# Patient Record
Sex: Female | Born: 1980 | Race: White | Hispanic: No | Marital: Married | Smoking: Current every day smoker
Health system: Southern US, Community
[De-identification: ages and names within clinical notes are randomized; demographics above are authoritative.]

## PROBLEM LIST (undated history)

## (undated) DIAGNOSIS — F329 Major depressive disorder, single episode, unspecified: Secondary | ICD-10-CM

## (undated) DIAGNOSIS — M8700 Idiopathic aseptic necrosis of unspecified bone: Secondary | ICD-10-CM

## (undated) DIAGNOSIS — M797 Fibromyalgia: Secondary | ICD-10-CM

## (undated) DIAGNOSIS — F32A Depression, unspecified: Secondary | ICD-10-CM

## (undated) DIAGNOSIS — K219 Gastro-esophageal reflux disease without esophagitis: Secondary | ICD-10-CM

## (undated) DIAGNOSIS — F4310 Post-traumatic stress disorder, unspecified: Secondary | ICD-10-CM

## (undated) DIAGNOSIS — T7840XA Allergy, unspecified, initial encounter: Secondary | ICD-10-CM

## (undated) DIAGNOSIS — M1990 Unspecified osteoarthritis, unspecified site: Secondary | ICD-10-CM

## (undated) DIAGNOSIS — F419 Anxiety disorder, unspecified: Secondary | ICD-10-CM

## (undated) DIAGNOSIS — G43909 Migraine, unspecified, not intractable, without status migrainosus: Secondary | ICD-10-CM

## (undated) DIAGNOSIS — J45909 Unspecified asthma, uncomplicated: Secondary | ICD-10-CM

## (undated) HISTORY — DX: Anxiety disorder, unspecified: F41.9

## (undated) HISTORY — PX: FOOT SURGERY: SHX648

## (undated) HISTORY — DX: Unspecified osteoarthritis, unspecified site: M19.90

## (undated) HISTORY — PX: TONSILLECTOMY: SUR1361

## (undated) HISTORY — DX: Major depressive disorder, single episode, unspecified: F32.9

## (undated) HISTORY — DX: Allergy, unspecified, initial encounter: T78.40XA

## (undated) HISTORY — DX: Migraine, unspecified, not intractable, without status migrainosus: G43.909

## (undated) HISTORY — DX: Unspecified asthma, uncomplicated: J45.909

## (undated) HISTORY — PX: CHOLECYSTECTOMY: SHX55

## (undated) HISTORY — DX: Idiopathic aseptic necrosis of unspecified bone: M87.00

## (undated) HISTORY — PX: JOINT REPLACEMENT: SHX530

## (undated) HISTORY — DX: Post-traumatic stress disorder, unspecified: F43.10

## (undated) HISTORY — DX: Gastro-esophageal reflux disease without esophagitis: K21.9

## (undated) HISTORY — PX: ABDOMINAL HYSTERECTOMY: SHX81

## (undated) HISTORY — DX: Depression, unspecified: F32.A

## (undated) HISTORY — PX: TUBAL LIGATION: SHX77

## (undated) HISTORY — PX: BLADDER SUSPENSION: SHX72

---

## 2013-12-07 ENCOUNTER — Ambulatory Visit (INDEPENDENT_AMBULATORY_CARE_PROVIDER_SITE_OTHER): Payer: Medicare Other | Admitting: General Surgery

## 2013-12-07 ENCOUNTER — Other Ambulatory Visit (INDEPENDENT_AMBULATORY_CARE_PROVIDER_SITE_OTHER): Payer: Self-pay | Admitting: General Surgery

## 2013-12-07 ENCOUNTER — Encounter (INDEPENDENT_AMBULATORY_CARE_PROVIDER_SITE_OTHER): Payer: Self-pay | Admitting: General Surgery

## 2013-12-07 VITALS — BP 116/72 | HR 86 | Temp 97.0°F | Resp 18 | Ht 67.0 in | Wt 261.6 lb

## 2013-12-07 DIAGNOSIS — M129 Arthropathy, unspecified: Secondary | ICD-10-CM

## 2013-12-07 DIAGNOSIS — K5900 Constipation, unspecified: Secondary | ICD-10-CM

## 2013-12-07 DIAGNOSIS — M8700 Idiopathic aseptic necrosis of unspecified bone: Secondary | ICD-10-CM

## 2013-12-07 DIAGNOSIS — K219 Gastro-esophageal reflux disease without esophagitis: Secondary | ICD-10-CM

## 2013-12-07 DIAGNOSIS — M25559 Pain in unspecified hip: Secondary | ICD-10-CM

## 2013-12-07 DIAGNOSIS — M1990 Unspecified osteoarthritis, unspecified site: Secondary | ICD-10-CM

## 2013-12-07 NOTE — Patient Instructions (Signed)
Congratulations on starting your journey to a healthier life! Over the next few weeks you will be undergoing tests (x-rays and labs) and seeing specialists to help evaluate you for weight loss surgery.  These tests and consultations with a psychologist and nutritionist are needed to prepare you for the lifestyle changes that lie ahead and are often required by insurance companies to approve you for surgery.   Pathway to Surgery:  Over the next few weeks -->Lab work -->Radiology tests   - Chest x-ray - make sure your lungs are normal before surgery  - Upper GI - you drink barium and pictures are taken as it travels down your  esophagus and into your stomach - looks for reflux and a hiatal hernia which may  need to repaired at the same time as your weight loss surgery  - Abdominal Ultrasound - looks at your gallbladder and liver  - Mammogram - up to date mammogram if you are a female -->EKG  -->Sleep study - if you are felt to be at high risk for obstructive sleep apnea -->H. Pylori breath test ([employer]) - you surgeon may order this test to see if you have  a bacteria (H pylori) in your stomach which makes you at higher risk to develop a  ulcer or inflammation of your stomach -->Nutrition consultation -->Psychologist consultation -->Other specialist consults - your surgeon may determine that you need to see a  specialist like a cardiologist or pulmnologist depending on your health history -->Watch EMMI video about your planned weight loss surgery -->you can look at [email or web address] to learn more about weight loss surgery and  compare surgery outcomes -->you can look at our new website - [email or web address] - available mid-June 2015  Two weeks prior to surgery  Go on the extremely low carb liquid diet - this will decrease the size of your liver  which will make surgery safer - the nutritionist will go over this at a later date  Attend preoperative appointment with your surgeon  Attend  preoperative surgery class  One week prior to surgery  No aspirin products.  Tylenol is acceptable   24 hours prior to surgery  No alcoholic beverages  Report fever greater than 100.5 or excessive nasal drainage suggesting infection  Continue bariatric preop diet  Perform bowel prep if ordered  Do not eat or drink anything after midnight the night before surgery  Do not take any medications except those instructed by the anesthesiologist  Morning of surgery  Please arrive at the hospital at least 2 hours before your scheduled surgery time.  No makeup, fingernail polish or jewelry  Bring insurance cards with you  Bring your CPAP mask if you use this  

## 2013-12-07 NOTE — Progress Notes (Signed)
Patient ID: Jill Bruce, female   DOB: 1980-08-21, 33 y.o.   MRN: [id number]  Chief Complaint  Patient presents with  . Bariatric Pre-op    HPI Jill Bruce is a 33 y.o. female.   HPI 33 year old morbidly obese Caucasian female referred by Dr. Danelle BerryGauthier from the [facility] for evaluation of weight loss surgery. The patient states her weight has been an issue probably for about the past 10 years. Her morbid obesity is starting to affect her quality of life and she is interested in improving her quality of life. Despite numerous attempts for sustained weight loss she has been unsuccessful. She has tried [employer] fast, Atkins, [employer] Watchers, as well as dieting and exercise - all without any long-term success. She was most successful with dieting and exercise and lost 60 pounds but regained it all back.  She is most interested in a sleeve gastrectomy. She believes this will suit her lifestyle the best. She has several acquaintances who have had the procedure who have had good outcomes. She believes that the outcome with a lap band is too variable.   She was in [employer] for 4 years. She participated in our online seminar as well as in in person seminar. Past Medical History  Diagnosis Date  . Arthritis   . Asthma   . GERD (gastroesophageal reflux disease)   . Allergy   . OSA on CPAP     Past Surgical History  Procedure Laterality Date  . Tubal ligation    . Abdominal hysterectomy    . Cholecystectomy    . Foot surgery      History reviewed. No pertinent family history.  Social History History  Substance Use Topics  . Smoking status: Former Smoker    Quit date: 05/09/2008  . Smokeless tobacco: Not on file  . Alcohol Use: No    Allergies  Allergen Reactions  . Tape   . Augmentin [Amoxicillin-Pot Clavulanate] Rash  . Latex Rash    Current Outpatient Prescriptions  Medication Sig Dispense Refill  . albuterol (PROVENTIL HFA;VENTOLIN HFA) 108 (90  BASE) MCG/ACT inhaler Inhale into the lungs every 6 (six) hours as needed for wheezing or shortness of breath.      . budesonide (PULMICORT) 180 MCG/ACT inhaler Inhale into the lungs 2 (two) times daily.      . cetirizine (ZYRTEC) 10 MG tablet Take 10 mg by mouth daily.      Marland Kitchen. HYDROcodone-acetaminophen (NORCO) 10-325 MG per tablet Take 1 tablet by mouth every 6 (six) hours as needed.      . meloxicam (MOBIC) 15 MG tablet Take 15 mg by mouth daily.      . methylphenidate (RITALIN) 10 MG tablet Take 10 mg by mouth 2 (two) times daily.      . montelukast (SINGULAIR) 10 MG tablet Take 10 mg by mouth at bedtime.      . pantoprazole (PROTONIX) 40 MG tablet Take 40 mg by mouth daily.      . ranitidine (ZANTAC) 300 MG tablet Take 300 mg by mouth at bedtime.      . traMADol (ULTRAM) 50 MG tablet Take 50 mg by mouth every 6 (six) hours as needed.       No current facility-administered medications for this visit.    Review of Systems Review of Systems  Constitutional: Negative for fever, activity change, appetite change and unexpected weight change.  HENT: Negative for nosebleeds and trouble swallowing.   Eyes: Negative for photophobia and  visual disturbance.  Respiratory: Negative for chest tightness and shortness of breath.        Some occasional asthma symptoms takes an inhaler as needed. Remote hospitalization in 2009 for pneumonia for 5 days; positive obstructive sleep apnea on CPAP  Cardiovascular: Negative for chest pain and leg swelling.       Denies CP, SOB, orthopnea, PND, DOE  Gastrointestinal: Positive for constipation. Negative for nausea, vomiting, abdominal pain, blood in stool and abdominal distention.       Status post cholecystectomy; has reflux, takes medications for reflux. Chronic constipation-averages about one bowel movement a week  Genitourinary: Negative for dysuria, hematuria and difficulty urinating.       Status post hysterectomy secondary to "varicose veins in uterus"; has  ovaries  Musculoskeletal: Positive for arthralgias.       Has bilateral hip pain as well as bilateral knee pain. Has been diagnosed with avascular necrosis. Followed by orthopedic doctors at the [facility] in [place]. Has been getting intermittent injections  Skin: Negative for pallor and rash.  Neurological: Positive for headaches. Negative for dizziness, seizures, facial asymmetry and numbness.       Denies TIA and amaurosis fugax; has migraines 3-4 times he   Hematological: Negative for adenopathy. Does not bruise/bleed easily.  Psychiatric/Behavioral: Negative for behavioral problems and agitation.    Blood pressure 116/72, pulse 86, temperature 97 F (36.1 C), temperature source Temporal, resp. rate 18, height 5\' 7"  (1.702 m), weight 261 lb 9.6 oz (118.661 kg).  Physical Exam Physical Exam  Vitals reviewed. Constitutional: She is oriented to person, place, and time. She appears well-developed and well-nourished. No distress.  Morbidly obese  HENT:  Head: Normocephalic and atraumatic.  Right Ear: External ear normal.  Left Ear: External ear normal.  Eyes: Conjunctivae are normal. No scleral icterus.  Neck: Normal range of motion. Neck supple. No tracheal deviation present. No thyromegaly present.  Cardiovascular: Normal rate and normal heart sounds.   Pulmonary/Chest: Effort normal and breath sounds normal. No stridor. No respiratory distress. She has no wheezes.  Abdominal: Soft. She exhibits no distension. There is no tenderness. There is no rebound and no guarding.  Well-healed trocar site  Musculoskeletal: She exhibits no edema and no tenderness.  Lymphadenopathy:    She has no cervical adenopathy.  Neurological: She is alert and oriented to person, place, and time. She exhibits normal muscle tone.  Skin: Skin is warm and dry. No rash noted. She is not diaphoretic. No erythema.  Psychiatric: She has a normal mood and affect. Her behavior is normal. Judgment and thought content  normal.    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 I think the patient would be an acceptable candidate for Laparoscopic vertical sleeve gastrectomy.   We discussed laparoscopic sleeve gastrectomy. We discussed the preoperative, operative and postoperative process. Using diagrams, I explained the surgery in detail including the performance of an EGD near the end of the surgery and an Upper GI swallow study on POD 1. We discussed the typical hospital course including a 2-3 day stay baring any complications.   The patient was given educational material. I quoted the patient that most patients can lose up to 50-70% of their excess weight. We did discuss the possibility of weight regain several years  after the procedure.  The risks of infection, bleeding, pain, scarring, weight regain, too little or too much weight loss, vitamin deficiencies and need for lifelong vitamin supplementation, hair loss, need for protein supplementation, leaks, stricture, reflux, food intolerance, gallstone formation, hernia, need for reoperation and conversion to roux Y gastric bypass, need for open surgery, injury to spleen or surrounding structures, DVT's, PE, and death again discussed with the patient and the patient expressed understanding and desires to proceed with laparoscopic vertical sleeve gastrectomy, possible open, intraoperative endoscopy.  We discussed that before and after surgery that there would be an alteration in their diet. I explained that we have put them on a diet 2 weeks before surgery. I also explained that they would be on a liquid diet for 2 weeks after surgery. We discussed that they would have to avoid certain foods after surgery. We discussed the importance of physical  activity as well as compliance with our dietary and supplement recommendations and routine follow-up.  I explained to the patient that we will start our evaluation process which includes labs, Upper GI to evaluate stomach and swallowing anatomy, nutritionist consultation, psychiatrist consultation, EKG, CXR.  She was also given access to watch the EMMI video on sleeve gastrectomy  Mary Sella. Andrey Campanile, MD, FACS General, Bariatric, & Minimally Invasive Surgery [facility], [place]         Atilano Ina 12/07/2013, 7:37 PM

## 2013-12-25 ENCOUNTER — Other Ambulatory Visit (INDEPENDENT_AMBULATORY_CARE_PROVIDER_SITE_OTHER): Payer: Self-pay | Admitting: General Surgery

## 2013-12-25 LAB — CBC WITH DIFFERENTIAL/PLATELET
BASOS ABS: 0.1 10*3/uL (ref 0.0–0.1)
BASOS PCT: 1 % (ref 0–1)
EOS PCT: 6 % — AB (ref 0–5)
Eosinophils Absolute: 0.5 10*3/uL (ref 0.0–0.7)
HCT: 38.8 % (ref 36.0–46.0)
Hemoglobin: 13.1 g/dL (ref 12.0–15.0)
Lymphocytes Relative: 27 % (ref 12–46)
Lymphs Abs: 2.4 10*3/uL (ref 0.7–4.0)
MCH: 28.6 pg (ref 26.0–34.0)
MCHC: 33.8 g/dL (ref 30.0–36.0)
MCV: 84.7 fL (ref 78.0–100.0)
Monocytes Absolute: 0.8 10*3/uL (ref 0.1–1.0)
Monocytes Relative: 9 % (ref 3–12)
Neutro Abs: 5 10*3/uL (ref 1.7–7.7)
Neutrophils Relative %: 57 % (ref 43–77)
Platelets: 334 10*3/uL (ref 150–400)
RBC: 4.58 MIL/uL (ref 3.87–5.11)
RDW: 14 % (ref 11.5–15.5)
WBC: 8.8 10*3/uL (ref 4.0–10.5)

## 2013-12-25 LAB — CMP AND LIVER
ALT: 14 U/L (ref 0–35)
AST: 18 U/L (ref 0–37)
Albumin: 4.3 g/dL (ref 3.5–5.2)
Alkaline Phosphatase: 68 U/L (ref 39–117)
BILIRUBIN DIRECT: 0.1 mg/dL (ref 0.0–0.3)
BILIRUBIN TOTAL: 0.5 mg/dL (ref 0.2–1.2)
BUN: 11 mg/dL (ref 6–23)
CHLORIDE: 106 meq/L (ref 96–112)
CO2: 23 meq/L (ref 19–32)
Calcium: 9.4 mg/dL (ref 8.4–10.5)
Creat: 0.82 mg/dL (ref 0.50–1.10)
Glucose, Bld: 80 mg/dL (ref 70–99)
Indirect Bilirubin: 0.4 mg/dL (ref 0.2–1.2)
Potassium: 4.3 mEq/L (ref 3.5–5.3)
SODIUM: 139 meq/L (ref 135–145)
TOTAL PROTEIN: 7 g/dL (ref 6.0–8.3)

## 2013-12-25 LAB — MAGNESIUM: Magnesium: 2.1 mg/dL (ref 1.5–2.5)

## 2013-12-25 LAB — LIPID PANEL
CHOL/HDL RATIO: 3.4 ratio
CHOLESTEROL: 144 mg/dL (ref 0–200)
HDL: 42 mg/dL (ref >39)
LDL Cholesterol: 88 mg/dL (ref 0–99)
Triglycerides: 68 mg/dL (ref <150)
VLDL: 14 mg/dL (ref 0–40)

## 2013-12-25 LAB — IRON AND TIBC
%SAT: 23 % (ref 20–55)
Iron: 68 ug/dL (ref 42–145)
TIBC: 300 ug/dL (ref 250–470)
UIBC: 232 ug/dL (ref 125–400)

## 2013-12-25 LAB — HEMOGLOBIN A1C
HEMOGLOBIN A1C: 5.8 % — AB (ref <5.7)
Mean Plasma Glucose: 120 mg/dL — ABNORMAL HIGH (ref <117)

## 2013-12-26 LAB — URINALYSIS
BILIRUBIN URINE: NEGATIVE
GLUCOSE, UA: NEGATIVE mg/dL
HGB URINE DIPSTICK: NEGATIVE
Ketones, ur: NEGATIVE mg/dL
Nitrite: NEGATIVE
PROTEIN: NEGATIVE mg/dL
Specific Gravity, Urine: 1.024 (ref 1.005–1.030)
Urobilinogen, UA: 0.2 mg/dL (ref 0.0–1.0)
pH: 5 (ref 5.0–8.0)

## 2013-12-26 LAB — H. PYLORI ANTIBODY, IGG: H Pylori IgG: 0.82 {ISR}

## 2013-12-26 LAB — TSH: TSH: 0.963 u[IU]/mL (ref 0.350–4.500)

## 2014-01-04 ENCOUNTER — Ambulatory Visit (HOSPITAL_COMMUNITY)
Admission: RE | Admit: 2014-01-04 | Discharge: 2014-01-04 | Disposition: A | Discharge status: Home / Self Care | Payer: Medicare Other | Source: Ambulatory Visit | Attending: General Surgery | Admitting: General Surgery

## 2014-01-04 ENCOUNTER — Other Ambulatory Visit: Payer: Self-pay

## 2014-01-04 DIAGNOSIS — M1990 Unspecified osteoarthritis, unspecified site: Secondary | ICD-10-CM | POA: Insufficient documentation

## 2014-01-04 DIAGNOSIS — K219 Gastro-esophageal reflux disease without esophagitis: Secondary | ICD-10-CM | POA: Insufficient documentation

## 2014-01-04 DIAGNOSIS — G4733 Obstructive sleep apnea (adult) (pediatric): Secondary | ICD-10-CM | POA: Insufficient documentation

## 2014-01-04 DIAGNOSIS — J45909 Unspecified asthma, uncomplicated: Secondary | ICD-10-CM | POA: Insufficient documentation

## 2014-01-04 DIAGNOSIS — Z9089 Acquired absence of other organs: Secondary | ICD-10-CM | POA: Insufficient documentation

## 2014-01-04 DIAGNOSIS — M8700 Idiopathic aseptic necrosis of unspecified bone: Secondary | ICD-10-CM | POA: Insufficient documentation

## 2014-01-04 DIAGNOSIS — M25559 Pain in unspecified hip: Secondary | ICD-10-CM | POA: Insufficient documentation

## 2014-01-04 DIAGNOSIS — K5900 Constipation, unspecified: Secondary | ICD-10-CM | POA: Insufficient documentation

## 2014-01-04 DIAGNOSIS — M129 Arthropathy, unspecified: Secondary | ICD-10-CM | POA: Insufficient documentation

## 2014-01-04 DIAGNOSIS — Z6841 Body Mass Index (BMI) 40.0 and over, adult: Secondary | ICD-10-CM | POA: Insufficient documentation

## 2014-01-22 ENCOUNTER — Encounter: Payer: Medicare Other | Attending: General Surgery | Admitting: Dietician

## 2014-01-22 ENCOUNTER — Encounter: Payer: Self-pay | Admitting: Dietician

## 2014-01-22 VITALS — Ht 67.0 in | Wt 258.2 lb

## 2014-01-22 DIAGNOSIS — Z8249 Family history of ischemic heart disease and other diseases of the circulatory system: Secondary | ICD-10-CM | POA: Diagnosis not present

## 2014-01-22 DIAGNOSIS — J45909 Unspecified asthma, uncomplicated: Secondary | ICD-10-CM | POA: Diagnosis not present

## 2014-01-22 DIAGNOSIS — M129 Arthropathy, unspecified: Secondary | ICD-10-CM | POA: Diagnosis not present

## 2014-01-22 DIAGNOSIS — G4733 Obstructive sleep apnea (adult) (pediatric): Secondary | ICD-10-CM | POA: Diagnosis not present

## 2014-01-22 DIAGNOSIS — Z713 Dietary counseling and surveillance: Secondary | ICD-10-CM | POA: Diagnosis present

## 2014-01-22 DIAGNOSIS — Z79899 Other long term (current) drug therapy: Secondary | ICD-10-CM | POA: Diagnosis not present

## 2014-01-22 DIAGNOSIS — Z833 Family history of diabetes mellitus: Secondary | ICD-10-CM | POA: Diagnosis not present

## 2014-01-22 DIAGNOSIS — K219 Gastro-esophageal reflux disease without esophagitis: Secondary | ICD-10-CM | POA: Diagnosis not present

## 2014-01-22 DIAGNOSIS — Z01818 Encounter for other preprocedural examination: Secondary | ICD-10-CM | POA: Diagnosis present

## 2014-01-22 DIAGNOSIS — Z6841 Body Mass Index (BMI) 40.0 and over, adult: Secondary | ICD-10-CM | POA: Diagnosis not present

## 2014-01-22 NOTE — Progress Notes (Signed)
  Pre-Op Assessment Visit:  Pre-Operative Sleeve Gastrectomy Surgery  Medical Nutrition Therapy:  Appt start time: 1430   End time:  1500.  Patient was seen on 01/22/2014 for Pre-Operative Sleeve Gastrectomy Nutrition Assessment. [facility] on 01/22/2014.   Preferred Learning Style:   No preference indicated   Learning Readiness:   Ready  Handouts given during visit include:  Pre-Op Goals Bariatric Surgery Protein Shakes  Teaching Method Utilized:  Visual Auditory Hands on  Barriers to learning/adherence to lifestyle change: joint pain makes exercise difficult  Demonstrated degree of understanding via:  Teach Back   Patient to call the [facility] to enroll in Pre-Op and Post-Op Nutrition Education when surgery date is scheduled.

## 2014-01-22 NOTE — Patient Instructions (Signed)
Follow Pre-Op Goals Try Protein Shakes  

## 2014-02-28 ENCOUNTER — Ambulatory Visit: Payer: Medicare Other | Admitting: Dietician

## 2014-03-06 ENCOUNTER — Encounter: Payer: Self-pay | Admitting: Dietician

## 2014-03-06 ENCOUNTER — Encounter: Payer: Medicare Other | Attending: General Surgery | Admitting: Dietician

## 2014-03-06 VITALS — Ht 67.0 in | Wt 245.0 lb

## 2014-03-06 DIAGNOSIS — Z713 Dietary counseling and surveillance: Secondary | ICD-10-CM | POA: Insufficient documentation

## 2014-03-06 DIAGNOSIS — Z6841 Body Mass Index (BMI) 40.0 and over, adult: Secondary | ICD-10-CM | POA: Insufficient documentation

## 2014-03-06 NOTE — Progress Notes (Signed)
  Medical Nutrition Therapy:  Appt start time: 315 end time:  330.  SWL visit 1:  Primary concerns today: Jill Bruce is here today for her 1st SWL visit out of 6 in preparation for sleeve gastrectomy. She recently lost 15 pounds, as she doesn't have an appetite due to recovering from knee replacement surgery. She reports she has been having protein shakes for breakfast and lunch. Has tried a variety of brands and flavors that meet recommended protein and carbohydrate criteria. Jill Bruce states she has been practicing chewing thoroughly and trying not to skip meals.  Sticking to baked and grilled foods.  MEDICATIONS: see list  DIETARY INTAKE:  24-hr recall:  B (AM): Protein shake  Snk ( AM) :cereal bar  L ( PM): grilled cheese or protein shake  Snk (PM): popcorn D ( PM): shake and bake pork chop; spaghetti   Snk ( PM): none Beverages: Water or diet Dr. Reino Kent  Recent physical activity: physical therapy 5x a week from knee replacement  Estimated energy needs: 1600-1800 calories  Progress Towards Goal(s):  In progress.   Nutritional Diagnosis:  Jill Bruce-3.3 Overweight/obesity related to past poor dietary habits and physical inactivity as evidenced by patient w/ pending gastric sleeve surgery following dietary guidelines for continued weight loss.     Intervention:  Nutrition counseling provided.   Monitoring/Evaluation:  Dietary intake, exercise, and body weight in 4 week(s).

## 2014-04-09 ENCOUNTER — Encounter: Payer: Medicare Other | Attending: General Surgery | Admitting: Dietician

## 2014-04-09 VITALS — Wt 237.5 lb

## 2014-04-09 DIAGNOSIS — Z6841 Body Mass Index (BMI) 40.0 and over, adult: Secondary | ICD-10-CM | POA: Insufficient documentation

## 2014-04-09 DIAGNOSIS — Z713 Dietary counseling and surveillance: Secondary | ICD-10-CM | POA: Insufficient documentation

## 2014-04-09 NOTE — Progress Notes (Signed)
  Medical Nutrition Therapy:  Appt start time: 905 end time:  920.  SWL visit 2:  Primary concerns today: Jill Bruce retuns today for her 2nd SWL visit out of 6 in preparation for sleeve gastrectomy. Has lost 8 pounds in the last month. She has not had much of an appetite since her knee replacement surgery. She is having very small portions but not skipping meals. Working on reducing Dr. Reino Kent and practicing not drinking while eating. Has tried Insurance account manager and [employer] protein shakes and likes them. Seems very motivated. Has fear of surgery, just started Prozac and Xanax. Seeing psychiatrist 1x a month.   MEDICATIONS: see list  DIETARY INTAKE:  24-hr recall:  B (AM): unsweet tea and fruit parfait from [employer] Snk ( AM) : L ( PM): sandwich or small serving of mac and cheese Snk (PM):  D ( PM): shake and bake chicken or baked pork chop; spaghetti   Snk ( PM): none  Beverages: unsweet tea, mostly water or diet Dr. Reino Kent  Recent physical activity: physical therapy 2x a week for an hour  Estimated energy needs: 1600-1800 calories  Progress Towards Goal(s):  In progress.   Nutritional Diagnosis:  [place]-3.3 Overweight/obesity related to past poor dietary habits and physical inactivity as evidenced by patient w/ pending gastric sleeve surgery following dietary guidelines for continued weight loss.     Intervention:  Nutrition counseling provided.   Monitoring/Evaluation:  Dietary intake, exercise, and body weight in 4 week(s).

## 2014-04-09 NOTE — Patient Instructions (Signed)
Follow Pre-Op Goals Try Protein Shakes  

## 2014-05-09 ENCOUNTER — Encounter: Payer: Medicare Other | Attending: General Surgery | Admitting: Dietician

## 2014-05-09 VITALS — Ht 67.0 in | Wt 226.9 lb

## 2014-05-09 DIAGNOSIS — Z6835 Body mass index (BMI) 35.0-35.9, adult: Secondary | ICD-10-CM | POA: Insufficient documentation

## 2014-05-09 DIAGNOSIS — Z713 Dietary counseling and surveillance: Secondary | ICD-10-CM | POA: Insufficient documentation

## 2014-05-09 NOTE — Patient Instructions (Signed)
Follow Pre-Op Goals Try Protein Shakes

## 2014-05-09 NOTE — Progress Notes (Signed)
  Medical Nutrition Therapy:  Appt start time: 1000 end time:  1015  SWL visit 3:  Primary concerns today: Jill Bruce retuns today for her 3rd SWL visit out of 6 in preparation for sleeve gastrectomy. She has lost another 10 pounds and attributes this to lack of appetite from most recent surgery. She has been eating small meals throughout the day and drinking less soda and more water. [name] states that her mobility has greatly improved. Has a hip replacement scheduled in December.   Wt Readings from Last 3 Encounters:  05/09/14 226 lb 14.4 oz (102.921 kg)  04/09/14 237 lb 8 oz (107.729 kg)  03/06/14 245 lb (111.131 kg)   Ht Readings from Last 3 Encounters:  05/09/14 5\' 7"  (1.702 m)  03/06/14 5\' 7"  (1.702 m)  01/22/14 5\' 7"  (1.702 m)   Body mass index is 35.53 kg/(m^2). @BMIFA @ Normalized weight-for-age data available only for age 25 to 20 years. Normalized stature-for-age data available only for age 25 to 20 years.   MEDICATIONS: see list  DIETARY INTAKE:  24-hr recall:  B (AM): unsweet tea and fruit parfait from [employer] Snk ( AM) : L ( PM): sandwich or small serving of mac and cheese Snk (PM):  D ( PM): shake and bake chicken or baked pork chop; spaghetti   Snk ( PM): none  Beverages: unsweet tea, mostly water or diet Dr. [name]  Recent physical activity: physical therapy 2x a week for an hour  Estimated energy needs: 1600-1800 calories  Progress Towards Goal(s):  In progress.   Nutritional Diagnosis:  [place]-3.3 Overweight/obesity related to past poor dietary habits and physical inactivity as evidenced by patient w/ pending gastric sleeve surgery following dietary guidelines for continued weight loss.     Intervention:  Nutrition counseling provided.  Plan: continue working on pre op goals   Monitoring/Evaluation:  Dietary intake, exercise, and body weight in 4 week(s).

## 2014-06-11 ENCOUNTER — Encounter: Payer: Medicare Other | Attending: General Surgery | Admitting: Dietician

## 2014-06-11 VITALS — Wt 229.9 lb

## 2014-06-11 DIAGNOSIS — E669 Obesity, unspecified: Secondary | ICD-10-CM | POA: Diagnosis not present

## 2014-06-11 DIAGNOSIS — Z713 Dietary counseling and surveillance: Secondary | ICD-10-CM | POA: Insufficient documentation

## 2014-06-11 NOTE — Progress Notes (Signed)
  Medical Nutrition Therapy:  Appt start time: 1125 end time:  1140  SWL visit 4:  Primary concerns today: Jill Bruce retuns today for her 4th SWL visit out of 6 in preparation for sleeve gastrectomy. She has gained a few pounds in the last month. She also has a hip replacement scheduled in a few weeks. Has tried several different protein shakes and likes them. Still having some hair loss from previous surgery and taking multivitamin and Biotin. Down to 2 sodas a week.   Plan: Keep working on cutting back on soda. Keep practicing pre op goals.   Wt Readings from Last 3 Encounters:  06/11/14 229 lb 14.4 oz (104.282 kg)  05/09/14 226 lb 14.4 oz (102.921 kg)  04/09/14 237 lb 8 oz (107.729 kg)   Ht Readings from Last 3 Encounters:  05/09/14 5\' 7"  (1.702 m)  03/06/14 5\' 7"  (1.702 m)  01/22/14 5\' 7"  (1.702 m)   There is no weight on file to calculate BMI. @BMIFA @ Normalized weight-for-age data available only for age 62 to 20 years. Normalized stature-for-age data available only for age 62 to 20 years.   MEDICATIONS: see list  DIETARY INTAKE:  24-hr recall:  B (AM): unsweet tea and fruit parfait from [employer] Snk ( AM) : L ( PM): sandwich or small serving of mac and cheese Snk (PM):  D ( PM): shake and bake chicken or baked pork chop; spaghetti   Snk ( PM): none  Beverages: unsweet tea, mostly water or diet Dr. [name]  Recent physical activity: physical therapy 2x a week for an hour  Estimated energy needs: 1600-1800 calories  Progress Towards Goal(s):  In progress.   Nutritional Diagnosis:  [place]-3.3 Overweight/obesity related to past poor dietary habits and physical inactivity as evidenced by patient w/ pending gastric sleeve surgery following dietary guidelines for continued weight loss.     Intervention:  Nutrition counseling provided.  Plan: continue working on pre op goals   Monitoring/Evaluation:  Dietary intake, exercise, and body weight in 4 week(s).

## 2014-06-11 NOTE — Patient Instructions (Addendum)
-  Keep following Pre-Op Goals -Continue weaning off sodas

## 2014-07-11 ENCOUNTER — Encounter: Payer: Medicare Other | Attending: General Surgery | Admitting: Dietician

## 2014-07-11 VITALS — Ht 67.0 in | Wt 226.0 lb

## 2014-07-11 DIAGNOSIS — E669 Obesity, unspecified: Secondary | ICD-10-CM | POA: Diagnosis not present

## 2014-07-11 DIAGNOSIS — Z713 Dietary counseling and surveillance: Secondary | ICD-10-CM | POA: Insufficient documentation

## 2014-07-11 DIAGNOSIS — Z6835 Body mass index (BMI) 35.0-35.9, adult: Secondary | ICD-10-CM | POA: Diagnosis not present

## 2014-07-11 NOTE — Progress Notes (Signed)
  Medical Nutrition Therapy:  Appt start time: 1200 end time:  1215  SWL visit 6:  Primary concerns today: Jill Bruce retuns today for her 6th SWL visit out of 7 in preparation for sleeve gastrectomy. She has lost a few pounds since last visit. Jill just had hip replacement surgery on the 10th of December. She reports that she has been working on pre op goals and eating more frequently throughout the day. She reports fears that the weight she has lost won't be sustainable.   Wt Readings from Last 3 Encounters:  07/11/14 226 lb (102.513 kg)  06/11/14 229 lb 14.4 oz (104.282 kg)  05/09/14 226 lb 14.4 oz (102.921 kg)   Ht Readings from Last 3 Encounters:  05/09/14 5\' 7"  (1.702 m)  03/06/14 5\' 7"  (1.702 m)  01/22/14 5\' 7"  (1.702 m)   There is no weight on file to calculate BMI. @BMIFA @ Normalized weight-for-age data available only for age 20 to 20 years. Normalized stature-for-age data available only for age 20 to 20 years.   MEDICATIONS: see list  DIETARY INTAKE:  24-hr recall:  B (AM): unsweet tea and fruit parfait from [employer] Snk ( AM) : L ( PM): sandwich or small serving of mac and cheese Snk (PM):  D ( PM): shake and bake chicken or baked pork chop; spaghetti   Snk ( PM): none  Beverages: unsweet tea, mostly water or diet Dr. [name]  Recent physical activity: physical therapy 2x a week for an hour  Estimated energy needs: 1600-1800 calories  Progress Towards Goal(s):  In progress.   Nutritional Diagnosis:  Tara Bruce-3.3 Overweight/obesity related to past poor dietary habits and physical inactivity as evidenced by patient w/ pending gastric sleeve surgery following dietary guidelines for continued weight loss.     Intervention:  Nutrition counseling provided.  Plan: continue working on pre op goals   Monitoring/Evaluation:  Dietary intake, exercise, and body weight in 4 week(s).

## 2014-08-14 ENCOUNTER — Encounter: Payer: Medicare Other | Attending: General Surgery | Admitting: Dietician

## 2014-08-14 VITALS — Ht 67.0 in | Wt 229.8 lb

## 2014-08-14 DIAGNOSIS — Z713 Dietary counseling and surveillance: Secondary | ICD-10-CM | POA: Insufficient documentation

## 2014-08-14 DIAGNOSIS — Z6835 Body mass index (BMI) 35.0-35.9, adult: Secondary | ICD-10-CM | POA: Insufficient documentation

## 2014-08-14 DIAGNOSIS — E669 Obesity, unspecified: Secondary | ICD-10-CM | POA: Diagnosis not present

## 2014-08-14 NOTE — Progress Notes (Signed)
  Medical Nutrition Therapy:  Appt start time: 915 end time:  930  SWL visit 7:  Primary concerns today: Jill Bruce retuns today for her 7th SWL visit out of 7 in preparation for sleeve gastrectomy. She has gained about 3 lbs since last visit. Has been working on portions, chewing well, and drinking one soda per week.    She is in physical therapy for her hip and is not able to do more exercise at this time.   Wt Readings from Last 3 Encounters:  08/14/14 229 lb 12.8 oz (104.237 kg)  07/11/14 226 lb (102.513 kg)  06/11/14 229 lb 14.4 oz (104.282 kg)   Ht Readings from Last 3 Encounters:  08/14/14 5\' 7"  (1.702 m)  07/11/14 5\' 7"  (1.702 m)  05/09/14 5\' 7"  (1.702 m)   Body mass index is 35.98 kg/(m^2). @BMIFA @ Normalized weight-for-age data available only for age 39 to 20 years. Normalized stature-for-age data available only for age 39 to 20 years.   MEDICATIONS: see list  DIETARY INTAKE:  24-hr recall:  B (AM): unsweet tea and fruit parfait from [employer] Snk ( AM) : L ( PM): sandwich or small serving of mac and cheese Snk (PM):  D ( PM): shake and bake chicken or baked pork chop; spaghetti   Snk ( PM): none  Beverages: unsweet tea, mostly water or diet Dr. Reino Bruce  Recent physical activity: physical therapy 2x a week for an hour  Estimated energy needs: 1600-1800 calories  Progress Towards Goal(s):  In progress.   Nutritional Diagnosis:  [place]-3.3 Overweight/obesity related to past poor dietary habits and physical inactivity as evidenced by patient w/ pending gastric sleeve surgery following dietary guidelines for continued weight loss.     Intervention:  Nutrition counseling provided.  Plan:  -Keep following Pre-Op Goals -Continue weaning off sodas -Call Jill Bruce at [phone] when surgery is scheduled to enroll in Pre-Op Class   Monitoring/Evaluation:  Dietary intake, exercise, and body weight and return for Pre-Op class

## 2014-08-14 NOTE — Patient Instructions (Addendum)
-  Keep following Pre-Op Goals -Continue weaning off sodas -Call [facility] at [phone] when surgery is scheduled to enroll in Pre-Op Class

## 2015-01-11 DIAGNOSIS — M25551 Pain in right hip: Secondary | ICD-10-CM | POA: Diagnosis not present

## 2015-01-16 DIAGNOSIS — M25551 Pain in right hip: Secondary | ICD-10-CM | POA: Diagnosis not present

## 2015-01-18 DIAGNOSIS — M25551 Pain in right hip: Secondary | ICD-10-CM | POA: Diagnosis not present

## 2015-01-23 DIAGNOSIS — M25551 Pain in right hip: Secondary | ICD-10-CM | POA: Diagnosis not present

## 2015-01-25 DIAGNOSIS — M25551 Pain in right hip: Secondary | ICD-10-CM | POA: Diagnosis not present

## 2015-01-28 DIAGNOSIS — M25561 Pain in right knee: Secondary | ICD-10-CM | POA: Diagnosis not present

## 2015-01-29 DIAGNOSIS — M25551 Pain in right hip: Secondary | ICD-10-CM | POA: Diagnosis not present

## 2015-02-05 DIAGNOSIS — M25551 Pain in right hip: Secondary | ICD-10-CM | POA: Diagnosis not present

## 2015-04-01 DIAGNOSIS — Z471 Aftercare following joint replacement surgery: Secondary | ICD-10-CM | POA: Diagnosis not present

## 2015-04-01 DIAGNOSIS — M25561 Pain in right knee: Secondary | ICD-10-CM | POA: Diagnosis not present

## 2015-04-01 DIAGNOSIS — Z96641 Presence of right artificial hip joint: Secondary | ICD-10-CM | POA: Diagnosis not present

## 2015-04-10 DIAGNOSIS — J45909 Unspecified asthma, uncomplicated: Secondary | ICD-10-CM | POA: Diagnosis not present

## 2015-04-10 DIAGNOSIS — F329 Major depressive disorder, single episode, unspecified: Secondary | ICD-10-CM | POA: Diagnosis not present

## 2015-04-10 DIAGNOSIS — F419 Anxiety disorder, unspecified: Secondary | ICD-10-CM | POA: Diagnosis not present

## 2015-04-10 DIAGNOSIS — E669 Obesity, unspecified: Secondary | ICD-10-CM | POA: Diagnosis not present

## 2015-04-10 DIAGNOSIS — Z720 Tobacco use: Secondary | ICD-10-CM | POA: Diagnosis not present

## 2015-04-10 DIAGNOSIS — M25559 Pain in unspecified hip: Secondary | ICD-10-CM | POA: Diagnosis not present

## 2015-04-10 DIAGNOSIS — K219 Gastro-esophageal reflux disease without esophagitis: Secondary | ICD-10-CM | POA: Diagnosis not present

## 2015-04-10 DIAGNOSIS — Z6831 Body mass index (BMI) 31.0-31.9, adult: Secondary | ICD-10-CM | POA: Diagnosis not present

## 2015-04-10 DIAGNOSIS — M25569 Pain in unspecified knee: Secondary | ICD-10-CM | POA: Diagnosis not present

## 2015-04-13 DIAGNOSIS — N83202 Unspecified ovarian cyst, left side: Secondary | ICD-10-CM | POA: Diagnosis not present

## 2015-04-13 DIAGNOSIS — M549 Dorsalgia, unspecified: Secondary | ICD-10-CM | POA: Diagnosis not present

## 2015-04-13 DIAGNOSIS — N83209 Unspecified ovarian cyst, unspecified side: Secondary | ICD-10-CM | POA: Diagnosis not present

## 2015-04-13 DIAGNOSIS — R109 Unspecified abdominal pain: Secondary | ICD-10-CM | POA: Diagnosis not present

## 2015-05-16 DIAGNOSIS — F410 Panic disorder [episodic paroxysmal anxiety] without agoraphobia: Secondary | ICD-10-CM | POA: Diagnosis not present

## 2015-06-27 DIAGNOSIS — F4310 Post-traumatic stress disorder, unspecified: Secondary | ICD-10-CM | POA: Diagnosis not present

## 2015-07-01 ENCOUNTER — Emergency Department (HOSPITAL_COMMUNITY)
Admission: EM | Admit: 2015-07-01 | Discharge: 2015-07-01 | Disposition: A | Discharge status: Home / Self Care | Payer: Medicare PPO | Attending: Emergency Medicine | Admitting: Emergency Medicine

## 2015-07-01 ENCOUNTER — Encounter (HOSPITAL_COMMUNITY): Payer: Self-pay

## 2015-07-01 DIAGNOSIS — Z791 Long term (current) use of non-steroidal anti-inflammatories (NSAID): Secondary | ICD-10-CM | POA: Insufficient documentation

## 2015-07-01 DIAGNOSIS — Z79899 Other long term (current) drug therapy: Secondary | ICD-10-CM | POA: Diagnosis not present

## 2015-07-01 DIAGNOSIS — J45909 Unspecified asthma, uncomplicated: Secondary | ICD-10-CM | POA: Insufficient documentation

## 2015-07-01 DIAGNOSIS — Z7951 Long term (current) use of inhaled steroids: Secondary | ICD-10-CM | POA: Insufficient documentation

## 2015-07-01 DIAGNOSIS — M1990 Unspecified osteoarthritis, unspecified site: Secondary | ICD-10-CM | POA: Diagnosis not present

## 2015-07-01 DIAGNOSIS — F17200 Nicotine dependence, unspecified, uncomplicated: Secondary | ICD-10-CM | POA: Diagnosis not present

## 2015-07-01 DIAGNOSIS — G43809 Other migraine, not intractable, without status migrainosus: Secondary | ICD-10-CM | POA: Insufficient documentation

## 2015-07-01 DIAGNOSIS — Z91040 Latex allergy status: Secondary | ICD-10-CM | POA: Insufficient documentation

## 2015-07-01 DIAGNOSIS — K219 Gastro-esophageal reflux disease without esophagitis: Secondary | ICD-10-CM | POA: Insufficient documentation

## 2015-07-01 DIAGNOSIS — R110 Nausea: Secondary | ICD-10-CM | POA: Diagnosis present

## 2015-07-01 MED ORDER — KETOROLAC TROMETHAMINE 30 MG/ML IJ SOLN
30.0000 mg | Freq: Once | INTRAMUSCULAR | Status: AC
  Administered 2015-07-01: 30 mg via INTRAVENOUS
  Filled 2015-07-01: qty 1

## 2015-07-01 MED ORDER — SODIUM CHLORIDE 0.9 % IV SOLN
Freq: Once | INTRAVENOUS | Status: AC
  Administered 2015-07-01: 21:00:00 via INTRAVENOUS

## 2015-07-01 MED ORDER — DIPHENHYDRAMINE HCL 50 MG/ML IJ SOLN
12.5000 mg | Freq: Once | INTRAMUSCULAR | Status: AC
  Administered 2015-07-01: 12.5 mg via INTRAVENOUS
  Filled 2015-07-01: qty 1

## 2015-07-01 MED ORDER — METOCLOPRAMIDE HCL 5 MG/ML IJ SOLN
10.0000 mg | Freq: Once | INTRAMUSCULAR | Status: AC
  Administered 2015-07-01: 10 mg via INTRAVENOUS
  Filled 2015-07-01: qty 2

## 2015-07-01 MED ORDER — OXYCODONE-ACETAMINOPHEN 5-325 MG PO TABS
1.0000 | ORAL_TABLET | Freq: Once | ORAL | Status: AC
  Administered 2015-07-01: 1 via ORAL

## 2015-07-01 MED ORDER — OXYCODONE-ACETAMINOPHEN 5-325 MG PO TABS
ORAL_TABLET | ORAL | Status: AC
  Filled 2015-07-01: qty 1

## 2015-07-01 NOTE — ED Provider Notes (Signed)
CSN: [id number]     Arrival date & time 07/01/15  2012 History   First MD Initiated Contact with Patient 07/01/15 2101     Chief Complaint  Patient presents with  . Migraine     (Consider location/radiation/quality/duration/timing/severity/associated sxs/prior Treatment) Patient is a 34 y.o. female presenting with migraines. The history is provided by the patient. No language interpreter was used.  Migraine This is a new problem. The current episode started today. The problem occurs constantly. The problem has been gradually worsening. Associated symptoms include fatigue and nausea. Pertinent negatives include no fever, neck pain, vomiting or weakness. Nothing aggravates the symptoms. She has tried nothing for the symptoms. The treatment provided no relief.  Pt has a histroy of migraine usually relieved by excedrin  Past Medical History  Diagnosis Date  . Arthritis   . Asthma   . GERD (gastroesophageal reflux disease)   . Allergy    Past Surgical History  Procedure Laterality Date  . Tubal ligation    . Abdominal hysterectomy    . Cholecystectomy    . Foot surgery    . Joint replacement    . Bladder suspension     Family History  Problem Relation Age of Onset  . COPD Other   . Hyperlipidemia Other   . Diabetes Other   . Heart disease Other   . Obesity Other   . Sleep apnea Other    Social History  Substance Use Topics  . Smoking status: Current Every Day Smoker    Last Attempt to Quit: 05/09/2008  . Smokeless tobacco: None  . Alcohol Use: No   OB History    No data available     Review of Systems  Constitutional: Positive for fatigue. Negative for fever.  Gastrointestinal: Positive for nausea. Negative for vomiting.  Musculoskeletal: Negative for neck pain.  Neurological: Negative for weakness.  All other systems reviewed and are negative.     Allergies  Augmentin; Latex; and Tape  Home Medications   Prior to Admission medications   Medication Sig  Start Date End Date Taking? Authorizing Provider  albuterol (PROVENTIL HFA;VENTOLIN HFA) 108 (90 BASE) MCG/ACT inhaler Inhale into the lungs every 6 (six) hours as needed for wheezing or shortness of breath.   Yes Historical Provider, MD  ALPRAZolam Prudy Feeler(XANAX) 1 MG tablet Take 1 mg by mouth 3 (three) times daily as needed for anxiety or sleep.  05/16/15  Yes Historical Provider, MD  budesonide (PULMICORT) 180 MCG/ACT inhaler Inhale into the lungs 2 (two) times daily.   Yes Historical Provider, MD  buPROPion (WELLBUTRIN XL) 150 MG 24 hr tablet Take 150 mg by mouth daily.   Yes Historical Provider, MD  cetirizine (ZYRTEC) 10 MG tablet Take 10 mg by mouth daily.   Yes Historical Provider, MD  FLUoxetine HCl 60 MG TABS Take 60 mg by mouth every morning. Reported on 07/01/2015 05/16/15   Historical Provider, MD  HYDROcodone-acetaminophen (NORCO) 10-325 MG per tablet Take 1 tablet by mouth every 6 (six) hours as needed.   Yes Historical Provider, MD  ibuprofen (ADVIL,MOTRIN) 800 MG tablet Take 800 mg by mouth 2 (two) times daily.   Yes Historical Provider, MD  montelukast (SINGULAIR) 10 MG tablet Take 10 mg by mouth at bedtime.   Yes Historical Provider, MD  pantoprazole (PROTONIX) 40 MG tablet Take 40 mg by mouth daily.   Yes Historical Provider, MD  prazosin (MINIPRESS) 5 MG capsule Take 5 mg by mouth at bedtime. 05/16/15  Yes Historical  Provider, MD  QUEtiapine (SEROQUEL) 50 MG tablet Take 50 mg by mouth at bedtime.  05/16/15  Yes Historical Provider, MD  ranitidine (ZANTAC) 300 MG tablet Take 300 mg by mouth at bedtime.   Yes Historical Provider, MD   BP 102/66 mmHg  Pulse 82  Temp(Src) 97.8 F (36.6 C) (Oral)  Resp 17  SpO2 98% Physical Exam  Constitutional: She is oriented to person, place, and time. She appears well-developed and well-nourished.  HENT:  Head: Normocephalic and atraumatic.  Right Ear: External ear normal.  Left Ear: External ear normal.  Nose: Nose normal.  Mouth/Throat:  Oropharynx is clear and moist.  Eyes: Conjunctivae and EOM are normal. Pupils are equal, round, and reactive to light.  Neck: Normal range of motion.  Cardiovascular: Normal rate and normal heart sounds.   Pulmonary/Chest: Effort normal.  Abdominal: Soft. She exhibits no distension.  Musculoskeletal: Normal range of motion.  Neurological: She is alert and oriented to person, place, and time.  Psychiatric: She has a normal mood and affect.  Nursing note and vitals reviewed.   ED Course  Procedures (including critical care time) Labs Review Labs Reviewed - No data to display  Imaging Review No results found. I have personally reviewed and evaluated these images and lab results as part of my medical decision-making.   EKG Interpretation None      MDM Pt feels better after Iv fluids, reglan and torodol.    Final diagnoses:  Other migraine without status migrainosus, not intractable        Elson Areas, PA-C 07/02/15 1548  Cathren Laine, MD 07/02/15 [phone]

## 2015-07-01 NOTE — ED Notes (Signed)
Pt here for headache, hx of migraines, pt reports onset today, last migriane she states that the severe pain lasted about 4 hours after taking any meds, today she did take medication with no relief this am.

## 2015-07-01 NOTE — Discharge Instructions (Signed)

## 2015-07-04 DIAGNOSIS — H04123 Dry eye syndrome of bilateral lacrimal glands: Secondary | ICD-10-CM | POA: Diagnosis not present

## 2015-07-04 DIAGNOSIS — G44219 Episodic tension-type headache, not intractable: Secondary | ICD-10-CM | POA: Diagnosis not present

## 2015-07-13 DIAGNOSIS — F17200 Nicotine dependence, unspecified, uncomplicated: Secondary | ICD-10-CM | POA: Diagnosis not present

## 2015-07-13 DIAGNOSIS — Z79899 Other long term (current) drug therapy: Secondary | ICD-10-CM | POA: Diagnosis not present

## 2015-07-13 DIAGNOSIS — R1012 Left upper quadrant pain: Secondary | ICD-10-CM | POA: Diagnosis not present

## 2015-07-13 DIAGNOSIS — R1032 Left lower quadrant pain: Secondary | ICD-10-CM | POA: Diagnosis not present

## 2015-07-13 DIAGNOSIS — J45909 Unspecified asthma, uncomplicated: Secondary | ICD-10-CM | POA: Diagnosis not present

## 2015-07-13 DIAGNOSIS — F419 Anxiety disorder, unspecified: Secondary | ICD-10-CM | POA: Diagnosis not present

## 2015-07-13 DIAGNOSIS — K219 Gastro-esophageal reflux disease without esophagitis: Secondary | ICD-10-CM | POA: Diagnosis not present

## 2015-07-13 DIAGNOSIS — N83202 Unspecified ovarian cyst, left side: Secondary | ICD-10-CM | POA: Diagnosis not present

## 2015-07-14 ENCOUNTER — Encounter (HOSPITAL_COMMUNITY): Payer: Self-pay | Admitting: *Deleted

## 2015-07-14 ENCOUNTER — Emergency Department (HOSPITAL_COMMUNITY)
Admission: EM | Admit: 2015-07-14 | Discharge: 2015-07-14 | Disposition: A | Discharge status: Home / Self Care | Payer: Medicare PPO | Attending: Emergency Medicine | Admitting: Emergency Medicine

## 2015-07-14 DIAGNOSIS — Z791 Long term (current) use of non-steroidal anti-inflammatories (NSAID): Secondary | ICD-10-CM | POA: Diagnosis not present

## 2015-07-14 DIAGNOSIS — Z79899 Other long term (current) drug therapy: Secondary | ICD-10-CM | POA: Diagnosis not present

## 2015-07-14 DIAGNOSIS — K219 Gastro-esophageal reflux disease without esophagitis: Secondary | ICD-10-CM | POA: Diagnosis not present

## 2015-07-14 DIAGNOSIS — F17210 Nicotine dependence, cigarettes, uncomplicated: Secondary | ICD-10-CM | POA: Diagnosis not present

## 2015-07-14 DIAGNOSIS — J45909 Unspecified asthma, uncomplicated: Secondary | ICD-10-CM | POA: Insufficient documentation

## 2015-07-14 DIAGNOSIS — M1990 Unspecified osteoarthritis, unspecified site: Secondary | ICD-10-CM | POA: Insufficient documentation

## 2015-07-14 DIAGNOSIS — N83202 Unspecified ovarian cyst, left side: Secondary | ICD-10-CM

## 2015-07-14 DIAGNOSIS — R110 Nausea: Secondary | ICD-10-CM | POA: Diagnosis not present

## 2015-07-14 DIAGNOSIS — R1032 Left lower quadrant pain: Secondary | ICD-10-CM

## 2015-07-14 DIAGNOSIS — Z7951 Long term (current) use of inhaled steroids: Secondary | ICD-10-CM | POA: Diagnosis not present

## 2015-07-14 DIAGNOSIS — Z91040 Latex allergy status: Secondary | ICD-10-CM | POA: Insufficient documentation

## 2015-07-14 LAB — URINALYSIS, ROUTINE W REFLEX MICROSCOPIC
BILIRUBIN URINE: NEGATIVE
Glucose, UA: NEGATIVE mg/dL
Hgb urine dipstick: NEGATIVE
KETONES UR: NEGATIVE mg/dL
Leukocytes, UA: NEGATIVE
NITRITE: NEGATIVE
PH: 5 (ref 5.0–8.0)
Protein, ur: NEGATIVE mg/dL
Specific Gravity, Urine: 1.019 (ref 1.005–1.030)

## 2015-07-14 LAB — COMPREHENSIVE METABOLIC PANEL
ALBUMIN: 3.7 g/dL (ref 3.5–5.0)
ALK PHOS: 60 U/L (ref 38–126)
ALT: 15 U/L (ref 14–54)
AST: 20 U/L (ref 15–41)
Anion gap: 9 (ref 5–15)
BILIRUBIN TOTAL: 0.3 mg/dL (ref 0.3–1.2)
BUN: 13 mg/dL (ref 6–20)
CALCIUM: 8.9 mg/dL (ref 8.9–10.3)
CO2: 24 mmol/L (ref 22–32)
CREATININE: 0.78 mg/dL (ref 0.44–1.00)
Chloride: 107 mmol/L (ref 101–111)
GFR calc Af Amer: >60 mL/min (ref >60)
GFR calc non Af Amer: >60 mL/min (ref >60)
GLUCOSE: 102 mg/dL — AB (ref 65–99)
Potassium: 4.1 mmol/L (ref 3.5–5.1)
Sodium: 140 mmol/L (ref 135–145)
TOTAL PROTEIN: 6.8 g/dL (ref 6.5–8.1)

## 2015-07-14 LAB — CBC
HCT: 39.3 % (ref 36.0–46.0)
Hemoglobin: 12.6 g/dL (ref 12.0–15.0)
MCH: 27 pg (ref 26.0–34.0)
MCHC: 32.1 g/dL (ref 30.0–36.0)
MCV: 84.3 fL (ref 78.0–100.0)
Platelets: 296 10*3/uL (ref 150–400)
RBC: 4.66 MIL/uL (ref 3.87–5.11)
RDW: 15.1 % (ref 11.5–15.5)
WBC: 9.3 10*3/uL (ref 4.0–10.5)

## 2015-07-14 LAB — LIPASE, BLOOD: Lipase: 35 U/L (ref 11–51)

## 2015-07-14 MED ORDER — ONDANSETRON 4 MG PO TBDP: 4.0000 mg | ORAL_TABLET | Freq: Three times a day (TID) | ORAL | Status: DC | PRN

## 2015-07-14 MED ORDER — OXYCODONE-ACETAMINOPHEN 5-325 MG PO TABS: 1.0000 | ORAL_TABLET | Freq: Four times a day (QID) | ORAL | Status: DC | PRN

## 2015-07-14 MED ORDER — NAPROXEN 500 MG PO TABS: 500.0000 mg | ORAL_TABLET | Freq: Two times a day (BID) | ORAL | Status: DC | PRN

## 2015-07-14 MED ORDER — ONDANSETRON 4 MG PO TBDP
8.0000 mg | ORAL_TABLET | Freq: Once | ORAL | Status: AC
  Administered 2015-07-14: 8 mg via ORAL
  Filled 2015-07-14: qty 2

## 2015-07-14 MED ORDER — OXYCODONE-ACETAMINOPHEN 5-325 MG PO TABS
2.0000 | ORAL_TABLET | Freq: Once | ORAL | Status: AC
  Administered 2015-07-14: 2 via ORAL
  Filled 2015-07-14: qty 2

## 2015-07-14 NOTE — ED Notes (Signed)
Pt unable to void presently, pt states she just went in the lobby.

## 2015-07-14 NOTE — ED Provider Notes (Signed)
CSN: [id number]     Arrival date & time 07/14/15  1331 History   First MD Initiated Contact with Patient 07/14/15 1626     Chief Complaint  Patient presents with  . Abdominal Pain     (Consider location/radiation/quality/duration/timing/severity/associated sxs/prior Treatment) HPI Comments: Jill Bruce is a 35 y.o. female with a PMHx of arthritis, GERD, asthma, and a PSHx of tubal ligation, abd hysterectomy (preserved ovaries), cholecystectomy, and bladder suspension, who presents to the ED with complaints of ongoing left lower quadrant pain that began yesterday around 11 AM. She was seen at [facility] had a pelvic u/s performed which ruled out ovarian torsion and revealed a 4.5cm left ovarian cyst. She states she has had prior CTs approximately 1 month ago for the same pain which at that time revealed a 4 cm cyst on the left ovary. She was sent home yesterday with Vicodin 10-3 25 mg and ibuprofen and told to follow-up with OB/GYN on Monday. She describes the pain is 8/10 constant stabbing left lower quadrant abdominal pain which is nonradiating, worse with walking and after urination, improved somewhat with heat, and unrelieved with Vicodin and ibuprofen. Associated symptoms include nausea. She came here today due to unrelieved pain despite taking pain medications at home.  She denies any fevers, chills, chest pain, shortness breath, vomiting, diarrhea, constipation, melena, hematochezia, obstipation, dysuria, hematuria, increased urinary frequency, vaginal bleeding or discharge, numbness, tingling, weakness, recent travel, sick contacts, suspicious food intake, chronic alcohol use, or recent antibiotics. She endorses positive chronic NSAID use. She states that she is on chronic narcotics for knee and hip pain and states that she has "a high tolerance" for pain medication.  Patient is a 35 y.o. female presenting with abdominal pain. The history is provided by the patient and medical  records. No language interpreter was used.  Abdominal Pain Pain location:  LLQ Pain quality: sharp   Pain radiates to:  Does not radiate Pain severity:  Moderate Onset quality:  Sudden Duration:  29 hours Timing:  Constant Progression:  Waxing and waning Chronicity:  Recurrent Context: not recent travel, not sick contacts and not suspicious food intake   Relieved by:  Heat Worsened by:  Movement and urination Ineffective treatments:  NSAIDs (and vicodin 10mg -325mg ) Associated symptoms: nausea   Associated symptoms: no chest pain, no chills, no constipation, no diarrhea, no dysuria, no fever, no flatus, no hematemesis, no hematochezia, no hematuria, no melena, no shortness of breath, no vaginal bleeding, no vaginal discharge and no vomiting   Risk factors: NSAID use   Risk factors: no alcohol abuse     Past Medical History  Diagnosis Date  . Arthritis   . Asthma   . GERD (gastroesophageal reflux disease)   . Allergy    Past Surgical History  Procedure Laterality Date  . Tubal ligation    . Abdominal hysterectomy    . Cholecystectomy    . Foot surgery    . Joint replacement    . Bladder suspension     Family History  Problem Relation Age of Onset  . COPD Other   . Hyperlipidemia Other   . Diabetes Other   . Heart disease Other   . Obesity Other   . Sleep apnea Other    Social History  Substance Use Topics  . Smoking status: Current Every Day Smoker    Last Attempt to Quit: 05/09/2008  . Smokeless tobacco: None  . Alcohol Use: No   OB History  No data available     Review of Systems  Constitutional: Negative for fever and chills.  Respiratory: Negative for shortness of breath.   Cardiovascular: Negative for chest pain.  Gastrointestinal: Positive for nausea and abdominal pain. Negative for vomiting, diarrhea, constipation, blood in stool, melena, hematochezia, flatus and hematemesis.  Genitourinary: Negative for dysuria, frequency, hematuria, flank pain,  vaginal bleeding and vaginal discharge.  Musculoskeletal: Negative for myalgias and arthralgias.  Skin: Negative for color change.  Allergic/Immunologic: Negative for immunocompromised state.  Neurological: Negative for weakness and numbness.  Psychiatric/Behavioral: Negative for confusion.   10 Systems reviewed and are negative for acute change except as noted in the HPI.    Allergies  Augmentin; Latex; and Tape  Home Medications   Prior to Admission medications   Medication Sig Start Date End Date Taking? Authorizing Provider  albuterol (PROVENTIL HFA;VENTOLIN HFA) 108 (90 BASE) MCG/ACT inhaler Inhale into the lungs every 6 (six) hours as needed for wheezing or shortness of breath.    Historical Provider, MD  ALPRAZolam Prudy Feeler(XANAX) 1 MG tablet Take 1 mg by mouth 3 (three) times daily as needed for anxiety or sleep.  05/16/15   Historical Provider, MD  budesonide (PULMICORT) 180 MCG/ACT inhaler Inhale into the lungs 2 (two) times daily.    Historical Provider, MD  buPROPion (WELLBUTRIN XL) 150 MG 24 hr tablet Take 150 mg by mouth daily.    Historical Provider, MD  cetirizine (ZYRTEC) 10 MG tablet Take 10 mg by mouth daily.    Historical Provider, MD  FLUoxetine HCl 60 MG TABS Take 60 mg by mouth every morning. Reported on 07/01/2015 05/16/15   Historical Provider, MD  HYDROcodone-acetaminophen (NORCO) 10-325 MG per tablet Take 1 tablet by mouth every 6 (six) hours as needed.    Historical Provider, MD  ibuprofen (ADVIL,MOTRIN) 800 MG tablet Take 800 mg by mouth 2 (two) times daily.    Historical Provider, MD  montelukast (SINGULAIR) 10 MG tablet Take 10 mg by mouth at bedtime.    Historical Provider, MD  pantoprazole (PROTONIX) 40 MG tablet Take 40 mg by mouth daily.    Historical Provider, MD  prazosin (MINIPRESS) 5 MG capsule Take 5 mg by mouth at bedtime. 05/16/15   Historical Provider, MD  QUEtiapine (SEROQUEL) 50 MG tablet Take 50 mg by mouth at bedtime.  05/16/15   Historical Provider, MD    ranitidine (ZANTAC) 300 MG tablet Take 300 mg by mouth at bedtime.    Historical Provider, MD   BP 115/83 mmHg  Pulse 100  Temp(Src) 98.1 F (36.7 C) (Oral)  Resp 18  SpO2 95% Physical Exam  Constitutional: She is oriented to person, place, and time. Vital signs are normal. She appears well-developed and well-nourished.  Non-toxic appearance. No distress.  Afebrile, nontoxic, NAD  HENT:  Head: Normocephalic and atraumatic.  Mouth/Throat: Oropharynx is clear and moist and mucous membranes are normal.  Eyes: Conjunctivae and EOM are normal. Right eye exhibits no discharge. Left eye exhibits no discharge.  Neck: Normal range of motion. Neck supple.  Cardiovascular: Normal rate, regular rhythm, normal heart sounds and intact distal pulses.  Exam reveals no gallop and no friction rub.   No murmur heard. Pulmonary/Chest: Effort normal and breath sounds normal. No respiratory distress. She has no decreased breath sounds. She has no wheezes. She has no rhonchi. She has no rales.  Abdominal: Soft. Normal appearance and bowel sounds are normal. She exhibits no distension. There is tenderness in the left lower quadrant. There  is no rigidity, no rebound, no guarding, no CVA tenderness and no tenderness at McBurney's point.    Soft, nondistended, +BS throughout, with mild LLQ TTP, no r/g/r, neg mcburney's, no CVA TTP   Musculoskeletal: Normal range of motion.  Neurological: She is alert and oriented to person, place, and time. She has normal strength. No sensory deficit.  Skin: Skin is warm, dry and intact. No rash noted.  Psychiatric: She has a normal mood and affect.  Nursing note and vitals reviewed.   ED Course  Procedures (including critical care time) Labs Review Labs Reviewed  COMPREHENSIVE METABOLIC PANEL - Abnormal; Notable for the following:    Glucose, Bld 102 (*)    All other components within normal limits  LIPASE, BLOOD  CBC  URINALYSIS, ROUTINE W REFLEX MICROSCOPIC (NOT AT  [facility])    Imaging Review No results found. I have personally reviewed and evaluated these images and lab results as part of my medical decision-making.   EKG Interpretation None      MDM   Final diagnoses:  LLQ abdominal pain  Cyst of left ovary  Nausea    35 y.o. female here for ongoing LLQ pain and nausea, was seen yesterday at [facility] and had a pelvic u/s which revealed L ovarian cyst, no torsion noted. Was given norco and told to f/up with OBGYN Monday. Having ongoing pain. No new symptoms. No urinary symptoms, no flank pain. On exam, mild TTP in LLQ, nonperitoneal. Given that she just had a work up yesterday including U/S to r/o torsion, doubt this is necessary to repeat. Will obtain these records. Labs here unremarkable. U/A not yet obtained, will attempt to get this now. Will give percocet here and zofran ODT in an attempt to control symptoms better. Will reassess shortly.   7:58 PM Multiple attempts were made to get records of her visit, no response from [place] [facility] after 3-4 phone calls. Given that pt remembers that it mentioned no torsion, and d/c summary states that she was to f/up with OBGYN for ongoing management (which would indicate that torsion was excluded during the ER visit), then will give up on trying to get results. Pain improved with percocet, I feel this is reasonable alternative to norco. Will give nausea meds. Pt feels better. U/A clear. Will have her f/up with OBGYN tomorrow. I explained the diagnosis and have given explicit precautions to return to the ER including for any other new or worsening symptoms. The patient understands and accepts the medical plan as it's been dictated and I have answered their questions. Discharge instructions concerning home care and prescriptions have been given. The patient is STABLE and is discharged to home in good condition.  BP 112/74 mmHg  Pulse 66  Temp(Src) 98.1 F (36.7 C) (Oral)  Resp 18  SpO2 98%  Meds  ordered this encounter  Medications  . oxyCODONE-acetaminophen (PERCOCET/ROXICET) 5-325 MG per tablet 2 tablet    Sig:   . ondansetron (ZOFRAN-ODT) disintegrating tablet 8 mg    Sig:   . ondansetron (ZOFRAN ODT) 4 MG disintegrating tablet    Sig: Take 1 tablet (4 mg total) by mouth every 8 (eight) hours as needed for nausea or vomiting.    Dispense:  15 tablet    Refill:  0    Order Specific Question:  Supervising Provider    Answer:  MILLER, BRIAN [[id number]]  . oxyCODONE-acetaminophen (PERCOCET) 5-325 MG tablet    Sig: Take 1-2 tablets by mouth every 6 (six) hours  as needed for severe pain.    Dispense:  20 tablet    Refill:  0    Order Specific Question:  Supervising Provider    Answer:  MILLER, BRIAN [[id number]]  . naproxen (NAPROSYN) 500 MG tablet    Sig: Take 1 tablet (500 mg total) by mouth 2 (two) times daily as needed for mild pain, moderate pain or headache (TAKE WITH MEALS.).    Dispense:  20 tablet    Refill:  0    Order Specific Question:  Supervising Provider    Answer:  Eber Hong [[id number]]     Brand Siever Camprubi-Soms, PA-C 07/14/15 2000  Azalia Bilis, MD 07/15/15 [phone]

## 2015-07-14 NOTE — Discharge Instructions (Signed)
Your pain is likely from the ovarian cyst. Start taking naprosyn and percocet instead of ibuprofen and vicodin, take as directed as needed for pain but don't drive while taking these. Use zofran as needed for nausea. Use heat to the areas of pain. Follow up with your OBGYN in the next 1-2 days for recheck and ongoing management of symptoms. Return to the ER for changes or worsening symptoms.  Abdominal (belly) pain can be caused by many things. Your caregiver performed an examination and possibly ordered blood/urine tests and imaging (CT scan, x-rays, ultrasound). Many cases can be observed and treated at home after initial evaluation in the emergency department. Even though you are being discharged home, abdominal pain can be unpredictable. Therefore, you need a repeated exam if your pain does not resolve, returns, or worsens. Most patients with abdominal pain don't have to be admitted to the hospital or have surgery, but serious problems like appendicitis and gallbladder attacks can start out as nonspecific pain. Many abdominal conditions cannot be diagnosed in one visit, so follow-up evaluations are very important. SEEK IMMEDIATE MEDICAL ATTENTION IF YOU DEVELOP ANY OF THE FOLLOWING SYMPTOMS:  The pain does not go away or becomes severe.   A temperature above 101 develops.   Repeated vomiting occurs (multiple episodes).   The pain becomes localized to portions of the abdomen. The right side could possibly be appendicitis. In an adult, the left lower portion of the abdomen could be colitis or diverticulitis.   Blood is being passed in stools or vomit (bright red or black tarry stools).   Return also if you develop chest pain, difficulty breathing, dizziness or fainting, or become confused, poorly responsive, or inconsolable (young children).  The constipation stays for more than 4 days.   There is belly (abdominal) or rectal pain.   You do not seem to be getting better.     Nausea,  Adult Nausea means you feel sick to your stomach or need to throw up (vomit). It may be a sign of a more serious problem. If nausea gets worse, you may throw up. If you throw up a lot, you may lose too much body fluid (dehydration). HOME CARE   Get plenty of rest.  Ask your doctor how to replace body fluid losses (rehydrate).  Eat small amounts of food. Sip liquids more often.  Take all medicines as told by your doctor. GET HELP RIGHT AWAY IF:  You have a fever.  You pass out (faint).  You keep throwing up or have blood in your throw up.  You are very weak, have dry lips or a dry mouth, or you are very thirsty (dehydrated).  You have dark or bloody poop (stool).  You have very bad chest or belly (abdominal) pain.  You do not get better after 2 days, or you get worse.  You have a headache. MAKE SURE YOU:  Understand these instructions.  Will watch your condition.  Will get help right away if you are not doing well or get worse.   This information is not intended to replace advice given to you by your health care provider. Make sure you discuss any questions you have with your health care provider.   Document Released: 06/18/2011 Document Revised: 09/21/2011 Document Reviewed: 06/18/2011 Elsevier Interactive Patient Education 2016 Else[employer] Inc[employer]  Abdominal Pain, Adult Many things can cause belly (abdominal) pain. Most times, the belly pain is not dangerous. Many cases of belly pain can be watched and treated at home. HOME  CARE   Do not take medicines that help you go poop (laxatives) unless told to by your doctor.  Only take medicine as told by your doctor.  Eat or drink as told by your doctor. Your doctor will tell you if you should be on a special diet. GET HELP IF:  You do not know what is causing your belly pain.  You have belly pain while you are sick to your stomach (nauseous) or have runny poop (diarrhea).  You have pain while you pee or poop.  Your  belly pain wakes you up at night.  You have belly pain that gets worse or better when you eat.  You have belly pain that gets worse when you eat fatty foods.  You have a fever. GET HELP RIGHT AWAY IF:   The pain does not go away within 2 hours.  You keep throwing up (vomiting).  The pain changes and is only in the right or left part of the belly.  You have bloody or tarry looking poop. MAKE SURE YOU:   Understand these instructions.  Will watch your condition.  Will get help right away if you are not doing well or get worse.   This information is not intended to replace advice given to you by your health care provider. Make sure you discuss any questions you have with your health care provider.   Document Released: 12/16/2007 Document Revised: 07/20/2014 Document Reviewed: 03/08/2013 Elsevier Interactive Patient Education [employer]2016 [employer] Inc[employer]

## 2015-07-14 NOTE — ED Notes (Signed)
Pt reports going to [facility] last night for abd pain and diagnosed with left ovarian cyst but here today due to increase in pain, no relief with meds at home. Denies n/v/d.

## 2015-07-18 ENCOUNTER — Encounter: Payer: Self-pay | Admitting: Neurology

## 2015-07-18 ENCOUNTER — Ambulatory Visit (INDEPENDENT_AMBULATORY_CARE_PROVIDER_SITE_OTHER): Payer: Medicare PPO | Admitting: Neurology

## 2015-07-18 VITALS — BP 123/84 | HR 99 | Ht 67.0 in | Wt 218.0 lb

## 2015-07-18 DIAGNOSIS — G43909 Migraine, unspecified, not intractable, without status migrainosus: Secondary | ICD-10-CM | POA: Insufficient documentation

## 2015-07-18 DIAGNOSIS — G43009 Migraine without aura, not intractable, without status migrainosus: Secondary | ICD-10-CM | POA: Diagnosis not present

## 2015-07-18 MED ORDER — RIZATRIPTAN BENZOATE 10 MG PO TBDP: 10.0000 mg | ORAL_TABLET | ORAL | Status: DC | PRN

## 2015-07-18 NOTE — Progress Notes (Signed)
PATIENT: Jill Bruce DOB: [birth year]  Chief Complaint  Patient presents with  . Migraine    She was 28 when she was diagnosed with migraines.  Recently, she has noted an increased frequency and severity of her migraines.  She has never been on any prophylactic medications.  She has only tried one triptan in the past but she is unsure which one.  Her last migraine caused her to go the ED for treatment.     HISTORICAL  Jill Bruce 35 years old right-handed female, seen in refer by her optometrist Dr. Conley Rolls for evaluation of frequent migraine headaches   I have reviewed her ophthalmology evaluation, there was reported bilateral pink healthy and distinct disc, normal ocular health  She reported a history of migraine since age 42, her typical migraine a hollow cranial severe pounding headache was associated light noise sensitivity, she used take Excedrin Migraine, which has helped her symptoms, she used to have migraine 4 to 5 times each year, but since September 2016, she began to have more frequent migraine headaches, couple times each months, also most severe prolonged migraine headaches, actually presented to the emergency room,  Excedrin Migraine usually help, but less effective for prolonged severe headaches,  she has never tried triptans in the past,  REVIEW OF SYSTEMS: Full 14 system review of systems performed and notable only for fatigue, spinning sensation, eye pain, cough, wheezing, feeling hot, joint pain, allergy, headaches, dizziness, anxiety  ALLERGIES: Allergies  Allergen Reactions  . Augmentin [Amoxicillin-Pot Clavulanate] Rash  . Latex Rash  . Tape Other (See Comments)    HOME MEDICATIONS: Current Outpatient Prescriptions  Medication Sig Dispense Refill  . albuterol (PROVENTIL HFA;VENTOLIN HFA) 108 (90 BASE) MCG/ACT inhaler Inhale into the lungs every 6 (six) hours as needed for wheezing or shortness of breath.    . ALPRAZolam (XANAX) 1 MG tablet  Take 1 mg by mouth 3 (three) times daily as needed for anxiety or sleep.   1  . budesonide (PULMICORT) 180 MCG/ACT inhaler Inhale into the lungs 2 (two) times daily.    Marland Kitchen buPROPion (WELLBUTRIN XL) 150 MG 24 hr tablet Take 150 mg by mouth daily.    . cetirizine (ZYRTEC) 10 MG tablet Take 10 mg by mouth daily.    Marland Kitchen HYDROcodone-acetaminophen (NORCO) 10-325 MG per tablet Take 1 tablet by mouth every 6 (six) hours as needed.    Marland Kitchen ibuprofen (ADVIL,MOTRIN) 800 MG tablet Take 800 mg by mouth 2 (two) times daily.    . montelukast (SINGULAIR) 10 MG tablet Take 10 mg by mouth at bedtime.    . naproxen (NAPROSYN) 500 MG tablet Take 1 tablet (500 mg total) by mouth 2 (two) times daily as needed for mild pain, moderate pain or headache (TAKE WITH MEALS.). 20 tablet 0  . oxyCODONE-acetaminophen (PERCOCET) 5-325 MG tablet Take 1-2 tablets by mouth every 6 (six) hours as needed for severe pain. 20 tablet 0  . pantoprazole (PROTONIX) 40 MG tablet Take 40 mg by mouth daily.    . prazosin (MINIPRESS) 5 MG capsule Take 5 mg by mouth at bedtime.  1  . QUEtiapine (SEROQUEL) 50 MG tablet Take 50 mg by mouth at bedtime.   1  . ranitidine (ZANTAC) 300 MG tablet Take 300 mg by mouth at bedtime.     No current facility-administered medications for this visit.    PAST MEDICAL HISTORY: Past Medical History  Diagnosis Date  . Arthritis   . Asthma   .  GERD (gastroesophageal reflux disease)   . Allergy   . Migraine     PAST SURGICAL HISTORY: Past Surgical History  Procedure Laterality Date  . Tubal ligation      2012  . Abdominal hysterectomy      2012  . Cholecystectomy      2009  . Foot surgery      2011  . Joint replacement      Right Hip and Right knee - both 2015  . Bladder suspension      2012    FAMILY HISTORY: Family History  Problem Relation Age of Onset  . COPD Father   . Diabetes Other     Paternal aunt and uncle  . Heart disease Father   . Stroke Father   . Heart attack Father   .  Sleep apnea Father   . Kidney failure Father   . Alzheimer's disease Paternal Grandmother     SOCIAL HISTORY:  Social History   Social History  . Marital Status: Married    Spouse Name: N/A  . Number of Children: 3  . Years of Education: Some clg   Occupational History  . Disabled    Social History Main Topics  . Smoking status: Current Every Day Smoker -- 0.50 packs/day    Types: Cigarettes    Last Attempt to Quit: 05/09/2008  . Smokeless tobacco: Not on file  . Alcohol Use: No  . Drug Use: No  . Sexual Activity: Not on file   Other Topics Concern  . Not on file   Social History Narrative   Lives at home with husband and children.   Right-handed.   1 cup of coffee per day.        PHYSICAL EXAM   Filed Vitals:   07/18/15 0904  BP: 123/84  Pulse: 99  Height: 5\' 7"  (1.702 m)  Weight: 218 lb (98.884 kg)    Not recorded      Body mass index is 34.14 kg/(m^2).  PHYSICAL EXAMNIATION:  Gen: NAD, conversant, well nourised, obese, well groomed                     Cardiovascular: Regular rate rhythm, no peripheral edema, warm, nontender. Eyes: Conjunctivae clear without exudates or hemorrhage Neck: Supple, no carotid bruise. Pulmonary: Clear to auscultation bilaterally   NEUROLOGICAL EXAM:  MENTAL STATUS: Speech:    Speech is normal; fluent and spontaneous with normal comprehension.  Cognition:     Orientation to time, place and person     Normal recent and remote memory     Normal Attention span and concentration     Normal Language, naming, repeating,spontaneous speech     Fund of knowledge   CRANIAL NERVES: CN II: Visual fields are full to confrontation. Fundoscopic exam is normal with sharp discs and no vascular changes. Pupils are round equal and briskly reactive to light. CN III, IV, VI: extraocular movement are normal. No ptosis. CN V: Facial sensation is intact to pinprick in all 3 divisions bilaterally. Corneal responses are intact.  CN VII:  Face is symmetric with normal eye closure and smile. CN VIII: Hearing is normal to rubbing fingers CN IX, X: Palate elevates symmetrically. Phonation is normal. CN XI: Head turning and shoulder shrug are intact CN XII: Tongue is midline with normal movements and no atrophy.  MOTOR: There is no pronator drift of out-stretched arms. Muscle bulk and tone are normal. Muscle strength is normal.  REFLEXES: Reflexes are  2+ and symmetric at the biceps, triceps, knees, and ankles. Plantar responses are flexor.  SENSORY: Intact to light touch, pinprick, position sense, and vibration sense are intact in fingers and toes.  COORDINATION: Rapid alternating movements and fine finger movements are intact. There is no dysmetria on finger-to-nose and heel-knee-shin.    GAIT/STANCE: Posture is normal. Gait is steady with normal steps, base, arm swing, and turning. Heel and toe walking are normal. Tandem gait is normal.  Romberg is absent.   DIAGNOSTIC DATA (LABS, IMAGING, TESTING) - I reviewed patient records, labs, notes, testing and imaging myself where available.   ASSESSMENT AND PLAN  Jill Bruce is a 35 y.o. female   Chronic migraine  Maxalt as needed   Levert FeinsteinYijun Shamira Toutant, M.D. Ph.D.  [facility] [address], [address] [place], [place] [address] Ph: [phone] Fax: [phone]  CC: My Mardene SayerHong Le, [place],

## 2015-07-23 ENCOUNTER — Encounter (HOSPITAL_COMMUNITY): Payer: Self-pay | Admitting: Emergency Medicine

## 2015-07-23 ENCOUNTER — Emergency Department (HOSPITAL_COMMUNITY)
Admission: EM | Admit: 2015-07-23 | Discharge: 2015-07-23 | Disposition: A | Discharge status: Home / Self Care | Payer: Medicare PPO | Attending: Emergency Medicine | Admitting: Emergency Medicine

## 2015-07-23 DIAGNOSIS — Z79899 Other long term (current) drug therapy: Secondary | ICD-10-CM | POA: Diagnosis not present

## 2015-07-23 DIAGNOSIS — R110 Nausea: Secondary | ICD-10-CM | POA: Insufficient documentation

## 2015-07-23 DIAGNOSIS — F17210 Nicotine dependence, cigarettes, uncomplicated: Secondary | ICD-10-CM | POA: Insufficient documentation

## 2015-07-23 DIAGNOSIS — R51 Headache: Secondary | ICD-10-CM | POA: Diagnosis not present

## 2015-07-23 DIAGNOSIS — Z91040 Latex allergy status: Secondary | ICD-10-CM | POA: Diagnosis not present

## 2015-07-23 DIAGNOSIS — R42 Dizziness and giddiness: Secondary | ICD-10-CM | POA: Insufficient documentation

## 2015-07-23 DIAGNOSIS — Z7951 Long term (current) use of inhaled steroids: Secondary | ICD-10-CM | POA: Diagnosis not present

## 2015-07-23 DIAGNOSIS — Z791 Long term (current) use of non-steroidal anti-inflammatories (NSAID): Secondary | ICD-10-CM | POA: Insufficient documentation

## 2015-07-23 DIAGNOSIS — M1990 Unspecified osteoarthritis, unspecified site: Secondary | ICD-10-CM | POA: Diagnosis not present

## 2015-07-23 DIAGNOSIS — J45909 Unspecified asthma, uncomplicated: Secondary | ICD-10-CM | POA: Diagnosis not present

## 2015-07-23 DIAGNOSIS — K219 Gastro-esophageal reflux disease without esophagitis: Secondary | ICD-10-CM | POA: Diagnosis not present

## 2015-07-23 DIAGNOSIS — R519 Headache, unspecified: Secondary | ICD-10-CM

## 2015-07-23 DIAGNOSIS — H53149 Visual discomfort, unspecified: Secondary | ICD-10-CM | POA: Diagnosis not present

## 2015-07-23 HISTORY — DX: Fibromyalgia: M79.7

## 2015-07-23 MED ORDER — METOCLOPRAMIDE HCL 5 MG/ML IJ SOLN
10.0000 mg | Freq: Once | INTRAMUSCULAR | Status: AC
  Administered 2015-07-23: 10 mg via INTRAVENOUS
  Filled 2015-07-23: qty 2

## 2015-07-23 MED ORDER — DIPHENHYDRAMINE HCL 50 MG/ML IJ SOLN
25.0000 mg | Freq: Once | INTRAMUSCULAR | Status: AC
  Administered 2015-07-23: 25 mg via INTRAVENOUS
  Filled 2015-07-23: qty 1

## 2015-07-23 MED ORDER — KETOROLAC TROMETHAMINE 15 MG/ML IJ SOLN
15.0000 mg | Freq: Once | INTRAMUSCULAR | Status: AC
  Administered 2015-07-23: 15 mg via INTRAVENOUS
  Filled 2015-07-23: qty 1

## 2015-07-23 MED ORDER — SODIUM CHLORIDE 0.9 % IV BOLUS (SEPSIS)
500.0000 mL | Freq: Once | INTRAVENOUS | Status: AC
  Administered 2015-07-23: 500 mL via INTRAVENOUS

## 2015-07-23 NOTE — ED Notes (Signed)
Pt c/o generalized HA starting this afternoon that feels like typical migraine; pt sts nausea and photophobia

## 2015-07-23 NOTE — ED Provider Notes (Signed)
CSN: [id number]     Arrival date & time 07/23/15  1640 History  By signing my name below, I, Tanda Rockers, attest that this documentation has been prepared under the direction and in the presence of [employer], PA-C. Electronically Signed: Tanda Rockers, ED Scribe. 07/23/2015. 7:26 PM.   Chief Complaint  Patient presents with  . Headache   The history is provided by the patient. No language interpreter was used.     HPI Comments: Jill Bruce is a 35 y.o. female who presents to the Emergency Department complaining of gradual onset, constant, occiput migraine headache that began at 2 PM today (approximately 5.5 hours ago). Pt has hx of migraine headaches and states this feels similar. She also complains of nausea, dizziness, and light sensitivity. She reports that earlier in the day objects were moving in her peripheral vision. Pt took Excedrin Migraine earlier today without relief, prompting her to come to the ED. Pt was seen in the ED 1 week ago for similar migraine headache and given migraine cocktail with relief. Denies weakness, numbness, tingling, dental pain, sinus pressure, neck stiffness, or any other associated symptoms.   Past Medical History  Diagnosis Date  . Arthritis   . Asthma   . GERD (gastroesophageal reflux disease)   . Allergy   . Migraine   . Fibromyalgia    Past Surgical History  Procedure Laterality Date  . Tubal ligation      2012  . Abdominal hysterectomy      2012  . Cholecystectomy      2009  . Foot surgery      2011  . Joint replacement      Right Hip and Right knee - both 2015  . Bladder suspension      2012   Family History  Problem Relation Age of Onset  . COPD Father   . Diabetes Other     Paternal aunt and uncle  . Heart disease Father   . Stroke Father   . Heart attack Father   . Sleep apnea Father   . Kidney failure Father   . Alzheimer's disease Paternal Grandmother    Social History  Substance Use Topics  . Smoking  status: Current Every Day Smoker -- 0.50 packs/day    Types: Cigarettes    Last Attempt to Quit: 05/09/2008  . Smokeless tobacco: None  . Alcohol Use: No   OB History    No data available     Review of Systems  Constitutional: Negative for fever.  HENT: Negative for congestion, dental problem, rhinorrhea and sinus pressure.   Eyes: Positive for photophobia. Negative for discharge, redness and visual disturbance.  Respiratory: Negative for shortness of breath.   Cardiovascular: Negative for chest pain.  Gastrointestinal: Positive for nausea. Negative for vomiting.  Musculoskeletal: Negative for gait problem, neck pain and neck stiffness.  Skin: Negative for rash.  Neurological: Positive for headaches. Negative for syncope, speech difficulty, weakness, light-headedness and numbness.  Psychiatric/Behavioral: Negative for confusion.      Allergies  Augmentin; Latex; and Tape  Home Medications   Prior to Admission medications   Medication Sig Start Date End Date Taking? Authorizing Provider  albuterol (PROVENTIL HFA;VENTOLIN HFA) 108 (90 BASE) MCG/ACT inhaler Inhale into the lungs every 6 (six) hours as needed for wheezing or shortness of breath.    Historical Provider, MD  ALPRAZolam Prudy Feeler) 1 MG tablet Take 1 mg by mouth 3 (three) times daily as needed for anxiety or sleep.  05/16/15   Historical Provider, MD  budesonide (PULMICORT) 180 MCG/ACT inhaler Inhale into the lungs 2 (two) times daily.    Historical Provider, MD  buPROPion (WELLBUTRIN XL) 150 MG 24 hr tablet Take 150 mg by mouth daily.    Historical Provider, MD  cetirizine (ZYRTEC) 10 MG tablet Take 10 mg by mouth daily.    Historical Provider, MD  HYDROcodone-acetaminophen (NORCO) 10-325 MG per tablet Take 1 tablet by mouth every 6 (six) hours as needed.    Historical Provider, MD  ibuprofen (ADVIL,MOTRIN) 800 MG tablet Take 800 mg by mouth 2 (two) times daily.    Historical Provider, MD  montelukast (SINGULAIR) 10 MG  tablet Take 10 mg by mouth at bedtime.    Historical Provider, MD  naproxen (NAPROSYN) 500 MG tablet Take 1 tablet (500 mg total) by mouth 2 (two) times daily as needed for mild pain, moderate pain or headache (TAKE WITH MEALS.). 07/14/15   Mercedes Camprubi-Soms, PA-C  oxyCODONE-acetaminophen (PERCOCET) 5-325 MG tablet Take 1-2 tablets by mouth every 6 (six) hours as needed for severe pain. 07/14/15   Mercedes Camprubi-Soms, PA-C  pantoprazole (PROTONIX) 40 MG tablet Take 40 mg by mouth daily.    Historical Provider, MD  prazosin (MINIPRESS) 5 MG capsule Take 5 mg by mouth at bedtime. 05/16/15   Historical Provider, MD  QUEtiapine (SEROQUEL) 50 MG tablet Take 50 mg by mouth at bedtime.  05/16/15   Historical Provider, MD  ranitidine (ZANTAC) 300 MG tablet Take 300 mg by mouth at bedtime.    Historical Provider, MD  rizatriptan (MAXALT-MLT) 10 MG disintegrating tablet Take 1 tablet (10 mg total) by mouth as needed. May repeat in 2 hours if needed 07/18/15   Levert Feinstein, MD   Triage Vitals:  BP 125/86 mmHg  Pulse 77  Temp(Src) 98 F (36.7 C) (Oral)  Resp 18  SpO2 98%   Physical Exam  Constitutional: She is oriented to person, place, and time. She appears well-developed and well-nourished. No distress.  HENT:  Head: Normocephalic and atraumatic.  Right Ear: Tympanic membrane, external ear and ear canal normal.  Left Ear: Tympanic membrane, external ear and ear canal normal.  Nose: Nose normal.  Mouth/Throat: Uvula is midline, oropharynx is clear and moist and mucous membranes are normal.  Eyes: Conjunctivae, EOM and lids are normal. Pupils are equal, round, and reactive to light. Right eye exhibits no nystagmus. Left eye exhibits no nystagmus.  Neck: Normal range of motion. Neck supple. No tracheal deviation present.  Cardiovascular: Normal rate and regular rhythm.   Pulmonary/Chest: Effort normal and breath sounds normal. No respiratory distress.  Abdominal: Soft. There is no tenderness.   Musculoskeletal: Normal range of motion.       Cervical back: She exhibits normal range of motion, no tenderness and no bony tenderness.  Neurological: She is alert and oriented to person, place, and time. She has normal strength and normal reflexes. No cranial nerve deficit or sensory deficit. She displays a negative Romberg sign. Coordination and gait normal. GCS eye subscore is 4. GCS verbal subscore is 5. GCS motor subscore is 6.  Skin: Skin is warm and dry.  Psychiatric: She has a normal mood and affect. Her behavior is normal.  Nursing note and vitals reviewed.   ED Course  Procedures (including critical care time)  DIAGNOSTIC STUDIES: Oxygen Saturation is 98% on RA, normal by my interpretation.    COORDINATION OF CARE: 7:24 PM-Discussed treatment plan which includes Reglan, Benadryl, and Toradol with pt  at bedside and pt agreed to plan.   Labs Review Labs Reviewed - No data to display  Imaging Review No results found.   EKG Interpretation None       8:35 PM patient much improved after treatment and wants to go home. Will discharge. Encouraged PCP/neurology follow-up as needed.  MDM   Final diagnoses:  Acute nonintractable headache, unspecified headache type   Patient without high-risk features of headache including: sudden onset/thunderclap HA, no similar headache in past, altered mental status, accompanying seizure, headache with exertion, age > 3550, history of immunocompromise, neck or shoulder pain, fever, use of anticoagulation, family history of spontaneous SAH, concomitant drug use, toxic exposure.   Patient has a normal complete neurological exam, normal vital signs, normal level of consciousness, no signs of meningismus, is well-appearing/non-toxic appearing, no signs of trauma.   Imaging with CT/MRI not indicated given history and physical exam findings.   No dangerous or life-threatening conditions suspected or identified by history, physical exam, and by  work-up. No indications for hospitalization identified.   I personally performed the services described in this documentation, which was scribed in my presence. The recorded information has been reviewed and is accurate.      Renne CriglerJoshua Fredrik Mogel, PA-C 07/23/15 2036  Loren Raceravid Yelverton, MD 07/23/15 2312

## 2015-07-23 NOTE — Discharge Instructions (Signed)
Please read and follow all provided instructions.  Your diagnoses today include:  1. Acute nonintractable headache, unspecified headache type     Tests performed today include:  Vital signs. See below for your results today.   Medications:  In the Emergency Department you received:  Reglan - antinausea/headache medication  Benadryl - antihistamine to counteract potential side effects of reglan  Toradol - NSAID medication similar to ibuprofen  Take any prescribed medications only as directed.  Additional information:  Follow any educational materials contained in this packet.  You are having a headache. No specific cause was found today for your headache. It may have been a migraine or other cause of headache. Stress, anxiety, fatigue, and depression are common triggers for headaches.   Your headache today does not appear to be life-threatening or require hospitalization, but often the exact cause of headaches is not determined in the emergency department. Therefore, follow-up with your doctor is very important to find out what may have caused your headache and whether or not you need any further diagnostic testing or treatment.   Sometimes headaches can appear benign (not harmful), but then more serious symptoms can develop which should prompt an immediate re-evaluation by your doctor or the emergency department.  BE VERY CAREFUL not to take multiple medicines containing Tylenol (also called acetaminophen). Doing so can lead to an overdose which can damage your liver and cause liver failure and possibly death.   Follow-up instructions: Please follow-up with your primary care provider in the next 3 days for further evaluation of your symptoms.   Return instructions:   Please return to the Emergency Department if you experience worsening symptoms.  Return if the medications do not resolve your headache, if it recurs, or if you have multiple episodes of vomiting or cannot keep  down fluids.  Return if you have a change from the usual headache.  RETURN IMMEDIATELY IF you:  Develop a sudden, severe headache  Develop confusion or become poorly responsive or faint  Develop a fever above 100.57F or problem breathing  Have a change in speech, vision, swallowing, or understanding  Develop new weakness, numbness, tingling, incoordination in your arms or legs  Have a seizure  Please return if you have any other emergent concerns.  Additional Information:  Your vital signs today were: BP 103/71 mmHg   Pulse 78   Temp(Src) 98 F (36.7 C) (Oral)   Resp 16   SpO2 98% If your blood pressure (BP) was elevated above 135/85 this visit, please have this repeated by your doctor within one month. --------------

## 2015-07-26 DIAGNOSIS — M87059 Idiopathic aseptic necrosis of unspecified femur: Secondary | ICD-10-CM | POA: Diagnosis not present

## 2015-07-26 DIAGNOSIS — R41840 Attention and concentration deficit: Secondary | ICD-10-CM | POA: Diagnosis not present

## 2015-07-26 DIAGNOSIS — Z79899 Other long term (current) drug therapy: Secondary | ICD-10-CM | POA: Diagnosis not present

## 2015-07-26 DIAGNOSIS — F4312 Post-traumatic stress disorder, chronic: Secondary | ICD-10-CM | POA: Diagnosis not present

## 2015-07-26 DIAGNOSIS — J45909 Unspecified asthma, uncomplicated: Secondary | ICD-10-CM | POA: Diagnosis not present

## 2015-08-01 DIAGNOSIS — N83202 Unspecified ovarian cyst, left side: Secondary | ICD-10-CM | POA: Diagnosis not present

## 2015-08-05 DIAGNOSIS — Z96642 Presence of left artificial hip joint: Secondary | ICD-10-CM | POA: Diagnosis not present

## 2015-08-05 DIAGNOSIS — Z96659 Presence of unspecified artificial knee joint: Secondary | ICD-10-CM | POA: Diagnosis not present

## 2015-08-05 DIAGNOSIS — Z471 Aftercare following joint replacement surgery: Secondary | ICD-10-CM | POA: Diagnosis not present

## 2015-08-26 DIAGNOSIS — F4310 Post-traumatic stress disorder, unspecified: Secondary | ICD-10-CM | POA: Diagnosis not present

## 2015-08-27 DIAGNOSIS — J4520 Mild intermittent asthma, uncomplicated: Secondary | ICD-10-CM | POA: Diagnosis not present

## 2015-08-27 DIAGNOSIS — F419 Anxiety disorder, unspecified: Secondary | ICD-10-CM | POA: Diagnosis not present

## 2015-08-27 DIAGNOSIS — Z6832 Body mass index (BMI) 32.0-32.9, adult: Secondary | ICD-10-CM | POA: Diagnosis not present

## 2015-08-27 DIAGNOSIS — F329 Major depressive disorder, single episode, unspecified: Secondary | ICD-10-CM | POA: Diagnosis not present

## 2015-08-27 DIAGNOSIS — F4310 Post-traumatic stress disorder, unspecified: Secondary | ICD-10-CM | POA: Diagnosis not present

## 2015-09-16 DIAGNOSIS — M25561 Pain in right knee: Secondary | ICD-10-CM | POA: Diagnosis not present

## 2015-09-30 DIAGNOSIS — J0100 Acute maxillary sinusitis, unspecified: Secondary | ICD-10-CM | POA: Diagnosis not present

## 2015-10-07 DIAGNOSIS — F4310 Post-traumatic stress disorder, unspecified: Secondary | ICD-10-CM | POA: Diagnosis not present

## 2015-10-09 ENCOUNTER — Other Ambulatory Visit: Payer: Self-pay

## 2015-10-09 NOTE — Patient Outreach (Signed)
[facility] Care Management  10/09/2015  Kelli HopeMonica A Sopko 05-27-1981 [id number]   [facility] for high [facility] utilization.  [facility] [place] [facility], but used to see Dr. Abner GreenspanBeth Hodges. 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  She has been to the ED for migraine headaches, and once due to an ovarian cyst.  Explanation of [facility] services provided.  Patient reports she doesn't think she needs our services, but she agreed for me to mail her some information.  She states she will call me back if she is interested.  Plan:  Mail letter to patient with pamphlet, magnet, and "Know Before You Go" flyer.

## 2015-10-15 NOTE — Patient Outreach (Signed)
[facility] Care Management  10/15/2015  Jill HopeMonica A Bruce 1980-08-06 [id number]  NOTE:  Case closure due to information received that patient is not eligible for [facility] at this [facility].  Tyler Deisonnie Darald Uzzle, RN, MSN RN [employer] [facility] [phone] Fax [phone]

## 2015-10-16 DIAGNOSIS — Z01419 Encounter for gynecological examination (general) (routine) without abnormal findings: Secondary | ICD-10-CM | POA: Diagnosis not present

## 2015-10-16 DIAGNOSIS — Z6834 Body mass index (BMI) 34.0-34.9, adult: Secondary | ICD-10-CM | POA: Diagnosis not present

## 2015-10-18 ENCOUNTER — Ambulatory Visit: Payer: Medicare PPO | Admitting: Nurse Practitioner

## 2015-10-21 ENCOUNTER — Encounter: Payer: Self-pay | Admitting: Nurse Practitioner

## 2015-10-23 ENCOUNTER — Ambulatory Visit: Payer: Medicare PPO | Admitting: Nurse Practitioner

## 2015-10-29 ENCOUNTER — Ambulatory Visit: Payer: Medicare PPO

## 2015-11-04 DIAGNOSIS — F4310 Post-traumatic stress disorder, unspecified: Secondary | ICD-10-CM | POA: Diagnosis not present

## 2015-11-25 ENCOUNTER — Ambulatory Visit: Payer: Medicare PPO | Admitting: Allergy and Immunology

## 2015-11-29 DIAGNOSIS — F4310 Post-traumatic stress disorder, unspecified: Secondary | ICD-10-CM | POA: Diagnosis not present

## 2016-01-31 DIAGNOSIS — Z6834 Body mass index (BMI) 34.0-34.9, adult: Secondary | ICD-10-CM | POA: Diagnosis not present

## 2016-01-31 DIAGNOSIS — E669 Obesity, unspecified: Secondary | ICD-10-CM | POA: Diagnosis not present

## 2016-01-31 DIAGNOSIS — M545 Low back pain: Secondary | ICD-10-CM | POA: Diagnosis not present

## 2016-01-31 DIAGNOSIS — J45909 Unspecified asthma, uncomplicated: Secondary | ICD-10-CM | POA: Diagnosis not present

## 2016-01-31 DIAGNOSIS — K219 Gastro-esophageal reflux disease without esophagitis: Secondary | ICD-10-CM | POA: Diagnosis not present

## 2016-01-31 DIAGNOSIS — M87051 Idiopathic aseptic necrosis of right femur: Secondary | ICD-10-CM | POA: Diagnosis not present

## 2016-01-31 DIAGNOSIS — F418 Other specified anxiety disorders: Secondary | ICD-10-CM | POA: Diagnosis not present

## 2016-01-31 DIAGNOSIS — T7840XD Allergy, unspecified, subsequent encounter: Secondary | ICD-10-CM | POA: Diagnosis not present

## 2016-01-31 DIAGNOSIS — F330 Major depressive disorder, recurrent, mild: Secondary | ICD-10-CM | POA: Diagnosis not present

## 2016-02-11 DIAGNOSIS — L255 Unspecified contact dermatitis due to plants, except food: Secondary | ICD-10-CM | POA: Diagnosis not present

## 2016-02-11 DIAGNOSIS — M62838 Other muscle spasm: Secondary | ICD-10-CM | POA: Diagnosis not present

## 2016-03-02 DIAGNOSIS — F4310 Post-traumatic stress disorder, unspecified: Secondary | ICD-10-CM | POA: Diagnosis not present

## 2016-04-02 DIAGNOSIS — J209 Acute bronchitis, unspecified: Secondary | ICD-10-CM | POA: Diagnosis not present

## 2016-04-05 DIAGNOSIS — J069 Acute upper respiratory infection, unspecified: Secondary | ICD-10-CM | POA: Diagnosis not present

## 2016-04-05 DIAGNOSIS — R05 Cough: Secondary | ICD-10-CM | POA: Diagnosis not present

## 2016-04-10 DIAGNOSIS — M25562 Pain in left knee: Secondary | ICD-10-CM | POA: Diagnosis not present

## 2016-04-10 DIAGNOSIS — M25561 Pain in right knee: Secondary | ICD-10-CM | POA: Diagnosis not present

## 2016-04-10 DIAGNOSIS — M25552 Pain in left hip: Secondary | ICD-10-CM | POA: Diagnosis not present

## 2016-04-10 DIAGNOSIS — M25551 Pain in right hip: Secondary | ICD-10-CM | POA: Diagnosis not present

## 2016-04-13 DIAGNOSIS — J189 Pneumonia, unspecified organism: Secondary | ICD-10-CM | POA: Diagnosis not present

## 2016-04-20 DIAGNOSIS — M879 Osteonecrosis, unspecified: Secondary | ICD-10-CM | POA: Diagnosis not present

## 2016-04-30 DIAGNOSIS — L820 Inflamed seborrheic keratosis: Secondary | ICD-10-CM | POA: Diagnosis not present

## 2016-04-30 DIAGNOSIS — D485 Neoplasm of uncertain behavior of skin: Secondary | ICD-10-CM | POA: Diagnosis not present

## 2016-04-30 DIAGNOSIS — L814 Other melanin hyperpigmentation: Secondary | ICD-10-CM | POA: Diagnosis not present

## 2016-05-04 DIAGNOSIS — M1612 Unilateral primary osteoarthritis, left hip: Secondary | ICD-10-CM | POA: Diagnosis not present

## 2016-05-04 DIAGNOSIS — M1712 Unilateral primary osteoarthritis, left knee: Secondary | ICD-10-CM | POA: Diagnosis not present

## 2016-05-04 DIAGNOSIS — M25552 Pain in left hip: Secondary | ICD-10-CM | POA: Diagnosis not present

## 2016-05-04 DIAGNOSIS — M25562 Pain in left knee: Secondary | ICD-10-CM | POA: Diagnosis not present

## 2016-05-07 DIAGNOSIS — Z96642 Presence of left artificial hip joint: Secondary | ICD-10-CM | POA: Diagnosis not present

## 2016-05-07 DIAGNOSIS — Z96641 Presence of right artificial hip joint: Secondary | ICD-10-CM | POA: Diagnosis not present

## 2016-05-07 DIAGNOSIS — S8390XA Sprain of unspecified site of unspecified knee, initial encounter: Secondary | ICD-10-CM | POA: Diagnosis not present

## 2016-05-18 DIAGNOSIS — M25562 Pain in left knee: Secondary | ICD-10-CM | POA: Diagnosis not present

## 2016-05-25 DIAGNOSIS — F4310 Post-traumatic stress disorder, unspecified: Secondary | ICD-10-CM | POA: Diagnosis not present

## 2016-06-30 DIAGNOSIS — T8182XA Emphysema (subcutaneous) resulting from a procedure, initial encounter: Secondary | ICD-10-CM | POA: Diagnosis not present

## 2016-06-30 DIAGNOSIS — G8918 Other acute postprocedural pain: Secondary | ICD-10-CM | POA: Diagnosis not present

## 2016-06-30 DIAGNOSIS — J45909 Unspecified asthma, uncomplicated: Secondary | ICD-10-CM | POA: Diagnosis not present

## 2016-06-30 DIAGNOSIS — M25562 Pain in left knee: Secondary | ICD-10-CM | POA: Diagnosis not present

## 2016-06-30 DIAGNOSIS — Z96652 Presence of left artificial knee joint: Secondary | ICD-10-CM | POA: Diagnosis not present

## 2016-06-30 DIAGNOSIS — M1712 Unilateral primary osteoarthritis, left knee: Secondary | ICD-10-CM | POA: Diagnosis not present

## 2016-06-30 DIAGNOSIS — G4733 Obstructive sleep apnea (adult) (pediatric): Secondary | ICD-10-CM | POA: Diagnosis not present

## 2016-06-30 DIAGNOSIS — M87052 Idiopathic aseptic necrosis of left femur: Secondary | ICD-10-CM | POA: Diagnosis not present

## 2016-06-30 DIAGNOSIS — Z87891 Personal history of nicotine dependence: Secondary | ICD-10-CM | POA: Diagnosis not present

## 2016-07-04 DIAGNOSIS — Z96653 Presence of artificial knee joint, bilateral: Secondary | ICD-10-CM | POA: Diagnosis not present

## 2016-07-04 DIAGNOSIS — Z471 Aftercare following joint replacement surgery: Secondary | ICD-10-CM | POA: Diagnosis not present

## 2016-07-04 DIAGNOSIS — Z96642 Presence of left artificial hip joint: Secondary | ICD-10-CM | POA: Diagnosis not present

## 2016-07-04 DIAGNOSIS — Z87891 Personal history of nicotine dependence: Secondary | ICD-10-CM | POA: Diagnosis not present

## 2016-07-04 DIAGNOSIS — J45909 Unspecified asthma, uncomplicated: Secondary | ICD-10-CM | POA: Diagnosis not present

## 2016-07-05 DIAGNOSIS — Z471 Aftercare following joint replacement surgery: Secondary | ICD-10-CM | POA: Diagnosis not present

## 2016-07-05 DIAGNOSIS — Z96653 Presence of artificial knee joint, bilateral: Secondary | ICD-10-CM | POA: Diagnosis not present

## 2016-07-05 DIAGNOSIS — Z96642 Presence of left artificial hip joint: Secondary | ICD-10-CM | POA: Diagnosis not present

## 2016-07-05 DIAGNOSIS — J45909 Unspecified asthma, uncomplicated: Secondary | ICD-10-CM | POA: Diagnosis not present

## 2016-07-05 DIAGNOSIS — Z87891 Personal history of nicotine dependence: Secondary | ICD-10-CM | POA: Diagnosis not present

## 2016-07-09 DIAGNOSIS — Z96653 Presence of artificial knee joint, bilateral: Secondary | ICD-10-CM | POA: Diagnosis not present

## 2016-07-09 DIAGNOSIS — Z96642 Presence of left artificial hip joint: Secondary | ICD-10-CM | POA: Diagnosis not present

## 2016-07-09 DIAGNOSIS — Z7982 Long term (current) use of aspirin: Secondary | ICD-10-CM | POA: Diagnosis not present

## 2016-07-09 DIAGNOSIS — Z9889 Other specified postprocedural states: Secondary | ICD-10-CM | POA: Diagnosis not present

## 2016-07-09 DIAGNOSIS — J45909 Unspecified asthma, uncomplicated: Secondary | ICD-10-CM | POA: Diagnosis not present

## 2016-07-09 DIAGNOSIS — M7989 Other specified soft tissue disorders: Secondary | ICD-10-CM | POA: Diagnosis not present

## 2016-07-09 DIAGNOSIS — M79662 Pain in left lower leg: Secondary | ICD-10-CM | POA: Diagnosis not present

## 2016-07-09 DIAGNOSIS — Z96652 Presence of left artificial knee joint: Secondary | ICD-10-CM | POA: Diagnosis not present

## 2016-07-09 DIAGNOSIS — Z91048 Other nonmedicinal substance allergy status: Secondary | ICD-10-CM | POA: Diagnosis not present

## 2016-07-09 DIAGNOSIS — Z471 Aftercare following joint replacement surgery: Secondary | ICD-10-CM | POA: Diagnosis not present

## 2016-07-09 DIAGNOSIS — Z87891 Personal history of nicotine dependence: Secondary | ICD-10-CM | POA: Diagnosis not present

## 2016-07-09 DIAGNOSIS — Z91040 Latex allergy status: Secondary | ICD-10-CM | POA: Diagnosis not present

## 2016-07-09 DIAGNOSIS — Z8614 Personal history of Methicillin resistant Staphylococcus aureus infection: Secondary | ICD-10-CM | POA: Diagnosis not present

## 2016-07-10 DIAGNOSIS — J45909 Unspecified asthma, uncomplicated: Secondary | ICD-10-CM | POA: Diagnosis not present

## 2016-07-10 DIAGNOSIS — Z96653 Presence of artificial knee joint, bilateral: Secondary | ICD-10-CM | POA: Diagnosis not present

## 2016-07-10 DIAGNOSIS — Z96642 Presence of left artificial hip joint: Secondary | ICD-10-CM | POA: Diagnosis not present

## 2016-07-10 DIAGNOSIS — Z87891 Personal history of nicotine dependence: Secondary | ICD-10-CM | POA: Diagnosis not present

## 2016-07-10 DIAGNOSIS — Z471 Aftercare following joint replacement surgery: Secondary | ICD-10-CM | POA: Diagnosis not present

## 2016-07-11 DIAGNOSIS — Z87891 Personal history of nicotine dependence: Secondary | ICD-10-CM | POA: Diagnosis not present

## 2016-07-11 DIAGNOSIS — Z96653 Presence of artificial knee joint, bilateral: Secondary | ICD-10-CM | POA: Diagnosis not present

## 2016-07-11 DIAGNOSIS — Z96642 Presence of left artificial hip joint: Secondary | ICD-10-CM | POA: Diagnosis not present

## 2016-07-11 DIAGNOSIS — Z471 Aftercare following joint replacement surgery: Secondary | ICD-10-CM | POA: Diagnosis not present

## 2016-07-11 DIAGNOSIS — J45909 Unspecified asthma, uncomplicated: Secondary | ICD-10-CM | POA: Diagnosis not present

## 2016-07-14 DIAGNOSIS — Z96653 Presence of artificial knee joint, bilateral: Secondary | ICD-10-CM | POA: Diagnosis not present

## 2016-07-14 DIAGNOSIS — Z96642 Presence of left artificial hip joint: Secondary | ICD-10-CM | POA: Diagnosis not present

## 2016-07-14 DIAGNOSIS — J45909 Unspecified asthma, uncomplicated: Secondary | ICD-10-CM | POA: Diagnosis not present

## 2016-07-14 DIAGNOSIS — Z471 Aftercare following joint replacement surgery: Secondary | ICD-10-CM | POA: Diagnosis not present

## 2016-07-14 DIAGNOSIS — Z87891 Personal history of nicotine dependence: Secondary | ICD-10-CM | POA: Diagnosis not present

## 2016-07-16 DIAGNOSIS — Z471 Aftercare following joint replacement surgery: Secondary | ICD-10-CM | POA: Diagnosis not present

## 2016-07-16 DIAGNOSIS — Z87891 Personal history of nicotine dependence: Secondary | ICD-10-CM | POA: Diagnosis not present

## 2016-07-16 DIAGNOSIS — Z96653 Presence of artificial knee joint, bilateral: Secondary | ICD-10-CM | POA: Diagnosis not present

## 2016-07-16 DIAGNOSIS — J45909 Unspecified asthma, uncomplicated: Secondary | ICD-10-CM | POA: Diagnosis not present

## 2016-07-16 DIAGNOSIS — Z96642 Presence of left artificial hip joint: Secondary | ICD-10-CM | POA: Diagnosis not present

## 2016-07-16 DIAGNOSIS — M25562 Pain in left knee: Secondary | ICD-10-CM | POA: Diagnosis not present

## 2016-07-17 DIAGNOSIS — J45909 Unspecified asthma, uncomplicated: Secondary | ICD-10-CM | POA: Diagnosis not present

## 2016-07-17 DIAGNOSIS — Z471 Aftercare following joint replacement surgery: Secondary | ICD-10-CM | POA: Diagnosis not present

## 2016-07-17 DIAGNOSIS — Z96653 Presence of artificial knee joint, bilateral: Secondary | ICD-10-CM | POA: Diagnosis not present

## 2016-07-17 DIAGNOSIS — Z96642 Presence of left artificial hip joint: Secondary | ICD-10-CM | POA: Diagnosis not present

## 2016-07-17 DIAGNOSIS — Z87891 Personal history of nicotine dependence: Secondary | ICD-10-CM | POA: Diagnosis not present

## 2016-07-20 DIAGNOSIS — Z96653 Presence of artificial knee joint, bilateral: Secondary | ICD-10-CM | POA: Diagnosis not present

## 2016-07-20 DIAGNOSIS — Z96642 Presence of left artificial hip joint: Secondary | ICD-10-CM | POA: Diagnosis not present

## 2016-07-20 DIAGNOSIS — Z87891 Personal history of nicotine dependence: Secondary | ICD-10-CM | POA: Diagnosis not present

## 2016-07-20 DIAGNOSIS — J45909 Unspecified asthma, uncomplicated: Secondary | ICD-10-CM | POA: Diagnosis not present

## 2016-07-20 DIAGNOSIS — Z471 Aftercare following joint replacement surgery: Secondary | ICD-10-CM | POA: Diagnosis not present

## 2016-07-22 DIAGNOSIS — Z96642 Presence of left artificial hip joint: Secondary | ICD-10-CM | POA: Diagnosis not present

## 2016-07-22 DIAGNOSIS — J45909 Unspecified asthma, uncomplicated: Secondary | ICD-10-CM | POA: Diagnosis not present

## 2016-07-22 DIAGNOSIS — Z471 Aftercare following joint replacement surgery: Secondary | ICD-10-CM | POA: Diagnosis not present

## 2016-07-22 DIAGNOSIS — Z87891 Personal history of nicotine dependence: Secondary | ICD-10-CM | POA: Diagnosis not present

## 2016-07-22 DIAGNOSIS — Z96653 Presence of artificial knee joint, bilateral: Secondary | ICD-10-CM | POA: Diagnosis not present

## 2016-07-23 DIAGNOSIS — Z471 Aftercare following joint replacement surgery: Secondary | ICD-10-CM | POA: Diagnosis not present

## 2016-07-23 DIAGNOSIS — Z96653 Presence of artificial knee joint, bilateral: Secondary | ICD-10-CM | POA: Diagnosis not present

## 2016-07-23 DIAGNOSIS — Z96642 Presence of left artificial hip joint: Secondary | ICD-10-CM | POA: Diagnosis not present

## 2016-07-23 DIAGNOSIS — Z87891 Personal history of nicotine dependence: Secondary | ICD-10-CM | POA: Diagnosis not present

## 2016-07-23 DIAGNOSIS — J45909 Unspecified asthma, uncomplicated: Secondary | ICD-10-CM | POA: Diagnosis not present

## 2016-07-24 DIAGNOSIS — Z87891 Personal history of nicotine dependence: Secondary | ICD-10-CM | POA: Diagnosis not present

## 2016-07-24 DIAGNOSIS — Z96653 Presence of artificial knee joint, bilateral: Secondary | ICD-10-CM | POA: Diagnosis not present

## 2016-07-24 DIAGNOSIS — Z96642 Presence of left artificial hip joint: Secondary | ICD-10-CM | POA: Diagnosis not present

## 2016-07-24 DIAGNOSIS — Z471 Aftercare following joint replacement surgery: Secondary | ICD-10-CM | POA: Diagnosis not present

## 2016-07-24 DIAGNOSIS — J45909 Unspecified asthma, uncomplicated: Secondary | ICD-10-CM | POA: Diagnosis not present

## 2016-07-28 DIAGNOSIS — M1712 Unilateral primary osteoarthritis, left knee: Secondary | ICD-10-CM | POA: Diagnosis not present

## 2016-07-28 DIAGNOSIS — M25562 Pain in left knee: Secondary | ICD-10-CM | POA: Diagnosis not present

## 2016-07-31 DIAGNOSIS — Z96653 Presence of artificial knee joint, bilateral: Secondary | ICD-10-CM | POA: Diagnosis not present

## 2016-07-31 DIAGNOSIS — Z87891 Personal history of nicotine dependence: Secondary | ICD-10-CM | POA: Diagnosis not present

## 2016-07-31 DIAGNOSIS — Z471 Aftercare following joint replacement surgery: Secondary | ICD-10-CM | POA: Diagnosis not present

## 2016-07-31 DIAGNOSIS — J45909 Unspecified asthma, uncomplicated: Secondary | ICD-10-CM | POA: Diagnosis not present

## 2016-07-31 DIAGNOSIS — M1712 Unilateral primary osteoarthritis, left knee: Secondary | ICD-10-CM | POA: Diagnosis not present

## 2016-07-31 DIAGNOSIS — Z96642 Presence of left artificial hip joint: Secondary | ICD-10-CM | POA: Diagnosis not present

## 2016-07-31 DIAGNOSIS — M25562 Pain in left knee: Secondary | ICD-10-CM | POA: Diagnosis not present

## 2016-08-03 DIAGNOSIS — M1712 Unilateral primary osteoarthritis, left knee: Secondary | ICD-10-CM | POA: Diagnosis not present

## 2016-08-03 DIAGNOSIS — M25562 Pain in left knee: Secondary | ICD-10-CM | POA: Diagnosis not present

## 2016-08-05 DIAGNOSIS — M25562 Pain in left knee: Secondary | ICD-10-CM | POA: Diagnosis not present

## 2016-08-05 DIAGNOSIS — M1712 Unilateral primary osteoarthritis, left knee: Secondary | ICD-10-CM | POA: Diagnosis not present

## 2016-08-07 DIAGNOSIS — M25562 Pain in left knee: Secondary | ICD-10-CM | POA: Diagnosis not present

## 2016-08-07 DIAGNOSIS — M1712 Unilateral primary osteoarthritis, left knee: Secondary | ICD-10-CM | POA: Diagnosis not present

## 2016-08-10 DIAGNOSIS — M25562 Pain in left knee: Secondary | ICD-10-CM | POA: Diagnosis not present

## 2016-08-10 DIAGNOSIS — M1712 Unilateral primary osteoarthritis, left knee: Secondary | ICD-10-CM | POA: Diagnosis not present

## 2016-08-12 DIAGNOSIS — E669 Obesity, unspecified: Secondary | ICD-10-CM | POA: Diagnosis not present

## 2016-08-12 DIAGNOSIS — M879 Osteonecrosis, unspecified: Secondary | ICD-10-CM | POA: Diagnosis not present

## 2016-08-12 DIAGNOSIS — J45909 Unspecified asthma, uncomplicated: Secondary | ICD-10-CM | POA: Diagnosis not present

## 2016-08-12 DIAGNOSIS — F330 Major depressive disorder, recurrent, mild: Secondary | ICD-10-CM | POA: Diagnosis not present

## 2016-08-12 DIAGNOSIS — F17200 Nicotine dependence, unspecified, uncomplicated: Secondary | ICD-10-CM | POA: Diagnosis not present

## 2016-08-12 DIAGNOSIS — F418 Other specified anxiety disorders: Secondary | ICD-10-CM | POA: Diagnosis not present

## 2016-08-12 DIAGNOSIS — M25562 Pain in left knee: Secondary | ICD-10-CM | POA: Diagnosis not present

## 2016-08-12 DIAGNOSIS — M1712 Unilateral primary osteoarthritis, left knee: Secondary | ICD-10-CM | POA: Diagnosis not present

## 2016-08-12 DIAGNOSIS — F4310 Post-traumatic stress disorder, unspecified: Secondary | ICD-10-CM | POA: Diagnosis not present

## 2016-08-12 DIAGNOSIS — K219 Gastro-esophageal reflux disease without esophagitis: Secondary | ICD-10-CM | POA: Diagnosis not present

## 2016-08-12 DIAGNOSIS — Z6834 Body mass index (BMI) 34.0-34.9, adult: Secondary | ICD-10-CM | POA: Diagnosis not present

## 2016-08-14 DIAGNOSIS — M1712 Unilateral primary osteoarthritis, left knee: Secondary | ICD-10-CM | POA: Diagnosis not present

## 2016-08-14 DIAGNOSIS — M25562 Pain in left knee: Secondary | ICD-10-CM | POA: Diagnosis not present

## 2016-08-17 DIAGNOSIS — F4310 Post-traumatic stress disorder, unspecified: Secondary | ICD-10-CM | POA: Diagnosis not present

## 2016-08-18 DIAGNOSIS — M1712 Unilateral primary osteoarthritis, left knee: Secondary | ICD-10-CM | POA: Diagnosis not present

## 2016-08-18 DIAGNOSIS — M25562 Pain in left knee: Secondary | ICD-10-CM | POA: Diagnosis not present

## 2016-08-19 DIAGNOSIS — M25562 Pain in left knee: Secondary | ICD-10-CM | POA: Diagnosis not present

## 2016-08-19 DIAGNOSIS — M1712 Unilateral primary osteoarthritis, left knee: Secondary | ICD-10-CM | POA: Diagnosis not present

## 2016-08-21 DIAGNOSIS — M1712 Unilateral primary osteoarthritis, left knee: Secondary | ICD-10-CM | POA: Diagnosis not present

## 2016-08-21 DIAGNOSIS — M25562 Pain in left knee: Secondary | ICD-10-CM | POA: Diagnosis not present

## 2016-08-26 DIAGNOSIS — M25562 Pain in left knee: Secondary | ICD-10-CM | POA: Diagnosis not present

## 2016-08-26 DIAGNOSIS — M1712 Unilateral primary osteoarthritis, left knee: Secondary | ICD-10-CM | POA: Diagnosis not present

## 2016-08-28 DIAGNOSIS — M25562 Pain in left knee: Secondary | ICD-10-CM | POA: Diagnosis not present

## 2016-08-28 DIAGNOSIS — M1712 Unilateral primary osteoarthritis, left knee: Secondary | ICD-10-CM | POA: Diagnosis not present

## 2016-09-07 DIAGNOSIS — K529 Noninfective gastroenteritis and colitis, unspecified: Secondary | ICD-10-CM | POA: Diagnosis not present

## 2016-09-07 DIAGNOSIS — R112 Nausea with vomiting, unspecified: Secondary | ICD-10-CM | POA: Diagnosis not present

## 2016-09-09 DIAGNOSIS — M1712 Unilateral primary osteoarthritis, left knee: Secondary | ICD-10-CM | POA: Diagnosis not present

## 2016-09-09 DIAGNOSIS — M25562 Pain in left knee: Secondary | ICD-10-CM | POA: Diagnosis not present

## 2016-09-11 DIAGNOSIS — M25562 Pain in left knee: Secondary | ICD-10-CM | POA: Diagnosis not present

## 2016-09-11 DIAGNOSIS — M1712 Unilateral primary osteoarthritis, left knee: Secondary | ICD-10-CM | POA: Diagnosis not present

## 2016-09-14 DIAGNOSIS — M1712 Unilateral primary osteoarthritis, left knee: Secondary | ICD-10-CM | POA: Diagnosis not present

## 2016-09-14 DIAGNOSIS — M25562 Pain in left knee: Secondary | ICD-10-CM | POA: Diagnosis not present

## 2016-09-17 DIAGNOSIS — M1712 Unilateral primary osteoarthritis, left knee: Secondary | ICD-10-CM | POA: Diagnosis not present

## 2016-09-17 DIAGNOSIS — M25562 Pain in left knee: Secondary | ICD-10-CM | POA: Diagnosis not present

## 2016-09-24 DIAGNOSIS — M1712 Unilateral primary osteoarthritis, left knee: Secondary | ICD-10-CM | POA: Diagnosis not present

## 2016-09-24 DIAGNOSIS — M25562 Pain in left knee: Secondary | ICD-10-CM | POA: Diagnosis not present

## 2016-11-06 DIAGNOSIS — M25552 Pain in left hip: Secondary | ICD-10-CM | POA: Diagnosis not present

## 2016-11-12 DIAGNOSIS — F4310 Post-traumatic stress disorder, unspecified: Secondary | ICD-10-CM | POA: Diagnosis not present

## 2016-11-13 ENCOUNTER — Ambulatory Visit (INDEPENDENT_AMBULATORY_CARE_PROVIDER_SITE_OTHER): Payer: Medicare PPO | Admitting: Allergy and Immunology

## 2016-11-13 ENCOUNTER — Encounter: Payer: Self-pay | Admitting: Allergy and Immunology

## 2016-11-13 VITALS — BP 120/84 | HR 120 | Temp 97.8°F | Resp 18 | Ht 66.5 in | Wt 251.0 lb

## 2016-11-13 DIAGNOSIS — J3089 Other allergic rhinitis: Secondary | ICD-10-CM

## 2016-11-13 DIAGNOSIS — J4530 Mild persistent asthma, uncomplicated: Secondary | ICD-10-CM | POA: Diagnosis not present

## 2016-11-13 DIAGNOSIS — H1045 Other chronic allergic conjunctivitis: Secondary | ICD-10-CM | POA: Diagnosis not present

## 2016-11-13 DIAGNOSIS — H1010 Acute atopic conjunctivitis, unspecified eye: Secondary | ICD-10-CM

## 2016-11-13 MED ORDER — OLOPATADINE HCL 0.1 % OP SOLN: 1.0000 [drp] | Freq: Two times a day (BID) | OPHTHALMIC | 5 refills | Status: DC

## 2016-11-13 NOTE — Progress Notes (Signed)
Dear Dr. Yetta Flock,  Thank you for referring Jill Bruce to the [facility] [place] on 11/13/2016.   Below is a summation of this patient's evaluation and recommendations.  Thank you for your referral. I will keep you informed about this patient's response to treatment.   If you have any questions please do not hesitate to contact me.   Sincerely,  Jill Priest, MD Allergy / Immunology [facility]   ______________________________________________________________________    NEW PATIENT NOTE  Referring Provider: Abner Greenspan, MD Primary Provider: Abner Greenspan, MD Date of office visit: 11/13/2016    Subjective:   Chief Complaint:  Jill Bruce (DOB: March 09, 1981) is a 36 y.o. female who presents to the clinic on 11/13/2016 with a chief complaint of Allergic Rhinitis  (allergy test was in 2009. has had antihistamines today) .     HPI: Jill Bruce presents to this clinic in evaluation of allergies. I had seen her in this clinic approximately 9 years ago at which point time she appeared to have problems with allergic rhinoconjunctivitis and intermittent asthma. She has continued to have problems with these issues with nasal congestion and sneezing and intermittent wheezing and coughing occurring on a perennial basis with springtime exacerbation usually following exposure to pollen, dust, cats, and dog. She's been using nasal fluticasone and Singulair. Her requirement for short acting bronchodilator is approximately 5 times per month. She has not required a systemic steroid or an antibiotic to treat her respiratory tract issue this past year.  She is interested in starting a course of immunotherapy.  Past Medical History:  Diagnosis Date  . Allergy   . Anxiety   . Arthritis   . Asthma   . Avascular necrosis (HCC)   . Depression   . Fibromyalgia   . GERD (gastroesophageal reflux  disease)   . Migraine   . Post traumatic stress disorder (PTSD)     Past Surgical History:  Procedure Laterality Date  . ABDOMINAL HYSTERECTOMY     2012  . BLADDER SUSPENSION     2012  . CHOLECYSTECTOMY     2009  . FOOT SURGERY     2011  . JOINT REPLACEMENT     Right Hip and Right knee - both 2015  . TUBAL LIGATION     2012    Allergies as of 11/13/2016      Reactions   Augmentin [amoxicillin-pot Clavulanate] Rash   Latex Rash   Tape Other (See Comments)      Medication List      albuterol 108 (90 Base) MCG/ACT inhaler Commonly known as:  PROVENTIL HFA;VENTOLIN HFA Inhale into the lungs every 6 (six) hours as needed for wheezing or shortness of breath.   ALPRAZolam 1 MG tablet Commonly known as:  XANAX Take 1 mg by mouth 3 (three) times daily as needed for anxiety or sleep.   cetirizine 10 MG tablet Commonly known as:  ZYRTEC Take 10 mg by mouth daily.   flunisolide 25 MCG/ACT (0.025%) Soln Commonly known as:  NASALIDE Place into the nose.   HYDROcodone-acetaminophen 10-325 MG tablet Commonly known as:  NORCO Take 1 tablet by mouth every 6 (six) hours as needed.   ibuprofen 800 MG tablet Commonly known as:  ADVIL,MOTRIN Take 800 mg by mouth 2 (two) times daily.   mirtazapine 15 MG tablet Commonly known as:  REMERON   montelukast 10 MG tablet Commonly known as:  SINGULAIR Take 10 mg by mouth at bedtime.   naproxen 500 MG tablet Commonly known as:  NAPROSYN Take 1 tablet (500 mg total) by mouth 2 (two) times daily as needed for mild pain, moderate pain or headache (TAKE WITH MEALS.).   pantoprazole 40 MG tablet Commonly known as:  PROTONIX Take 40 mg by mouth daily.   TRINTELLIX 20 MG Tabs Generic drug:  vortioxetine HBr       Review of systems negative except as noted in HPI / PMHx or noted below:  Review of Systems  Constitutional: Negative.   HENT: Negative.   Eyes: Negative.   Respiratory: Negative.   Cardiovascular: Negative.     Gastrointestinal: Negative.   Genitourinary: Negative.   Musculoskeletal: Negative.   Skin: Negative.   Neurological: Negative.   Endo/Heme/Allergies: Negative.   Psychiatric/Behavioral: Negative.     Family History  Problem Relation Age of Onset  . COPD Father   . Heart disease Father   . Stroke Father   . Heart attack Father   . Sleep apnea Father   . Kidney failure Father   . Diabetes Other     Paternal aunt and uncle  . Alzheimer's disease Paternal Grandmother     Social History   Social History  . Marital status: Married    Spouse name: N/A  . Number of children: 3  . Years of education: Some clg   Occupational History  . Disabled    Social History Main Topics  . Smoking status: Current Every Day Smoker    Packs/day: 0.50    Types: Cigarettes    Last attempt to quit: 05/09/2008  . Smokeless tobacco: Never Used  . Alcohol use No  . Drug use: No  . Sexual activity: Not on file   Other Topics Concern  . Not on file   Social History Narrative   Lives at home with husband and children.   Right-handed.   1 cup of coffee per day.       Environmental and Social history  Lives in a house with a dry environment, cats and dogs located inside the household, carpeting in the bedroom, plastic on both the bed and the pillow, and no smoking ongoing inside the household.  Objective:   Vitals:   11/13/16 0929  BP: 120/84  Pulse: (!) 120  Resp: 18  Temp: 97.8 F (36.6 C)   Height: 5' 6.5" (168.9 cm) Weight: 251 lb (113.9 kg)  Physical Exam  Constitutional: She is well-developed, well-nourished, and in no distress.  HENT:  Head: Normocephalic. Head is without right periorbital erythema and without left periorbital erythema.  Right Ear: Tympanic membrane, external ear and ear canal normal.  Left Ear: Tympanic membrane, external ear and ear canal normal.  Nose: Mucosal edema present. No rhinorrhea.  Mouth/Throat: Uvula is midline, oropharynx is clear and  moist and mucous membranes are normal. No oropharyngeal exudate.  Eyes: Conjunctivae and lids are normal. Pupils are equal, round, and reactive to light.  Neck: Trachea normal. No tracheal tenderness present. No tracheal deviation present. No thyromegaly present.  Cardiovascular: Normal rate, regular rhythm, S1 normal, S2 normal and normal heart sounds.   No murmur heard. Pulmonary/Chest: Effort normal and breath sounds normal. No stridor. No tachypnea. No respiratory distress. She has no wheezes. She has no rales. She exhibits no tenderness.  Abdominal: Soft. She exhibits no distension and no mass. There is no hepatosplenomegaly. There is no tenderness. There is no rebound and no guarding.  Musculoskeletal: She exhibits no edema or tenderness.  Lymphadenopathy:       Head (right side): No tonsillar adenopathy present.       Head (left side): No tonsillar adenopathy present.    She has no cervical adenopathy.    She has no axillary adenopathy.  Neurological: She is alert. Gait normal.  Skin: No rash noted. She is not diaphoretic. No erythema. No pallor. Nails show no clubbing.  Psychiatric: Mood and affect normal.    Diagnostics: Allergy skin tests were not performed.   Spirometry was performed and demonstrated an FEV1 of 3.36 @ 102 % of predicted.   Assessment and Plan:    1. Other allergic rhinitis   2. Seasonal allergic conjunctivitis   3. Asthma, well controlled, mild persistent     1. Allergen avoidance measures  2. Treat and prevent inflammation:   A. nasal fluticasone or flunisolide- 1-2 sprays each nostril 3-7 times a week  B. montelukast 10 mg - one tablet 1 time per day  3. If needed:   A. cetirizine 10 mg tablet 1 time per day  B. Proventil HFA or similar 2 puffs every 4-6 hours  C. Patanol one drop each eye twice a day  4. Start a course of immunotherapy based on previous skin test results.  5. Return to clinic in 6 months or earlier if problem  We will  start [facility] on a course of immunotherapy while she continues to use anti-inflammatory agents for her respiratory tract atopic disease. I will see her back in this clinic in approximately 6 months or earlier if there is a problem.  Jill Priest, MD [place] [place]

## 2016-11-13 NOTE — Patient Instructions (Addendum)
  1. Allergen avoidance measures  2. Treat and prevent inflammation:   A. nasal fluticasone or flunisolide- 1-2 sprays each nostril 3-7 times a week  B. montelukast 10 mg - one tablet 1 time per day  3. If needed:   A. cetirizine 10 mg tablet 1 time per day  B. Proventil HFA or similar 2 puffs every 4-6 hours  C. Patanol one drop each eye twice a day  4. Start a course of immunotherapy  5. Return to clinic in 6 months or earlier if problem

## 2016-11-18 ENCOUNTER — Other Ambulatory Visit: Payer: Self-pay | Admitting: Allergy and Immunology

## 2016-11-18 DIAGNOSIS — J3089 Other allergic rhinitis: Secondary | ICD-10-CM

## 2016-11-18 NOTE — Progress Notes (Signed)
Vials to be made 11-18-16 jm

## 2016-11-19 DIAGNOSIS — J301 Allergic rhinitis due to pollen: Secondary | ICD-10-CM | POA: Diagnosis not present

## 2016-11-26 ENCOUNTER — Encounter (HOSPITAL_COMMUNITY): Payer: Self-pay

## 2016-11-26 ENCOUNTER — Inpatient Hospital Stay (HOSPITAL_COMMUNITY)
Admission: AD | Admit: 2016-11-26 | Discharge: 2016-11-26 | Disposition: A | Discharge status: Home / Self Care | Payer: Medicare PPO | Source: Ambulatory Visit | Attending: Obstetrics and Gynecology | Admitting: Obstetrics and Gynecology

## 2016-11-26 ENCOUNTER — Inpatient Hospital Stay (HOSPITAL_COMMUNITY): Payer: Medicare PPO

## 2016-11-26 DIAGNOSIS — Z880 Allergy status to penicillin: Secondary | ICD-10-CM | POA: Insufficient documentation

## 2016-11-26 DIAGNOSIS — M797 Fibromyalgia: Secondary | ICD-10-CM | POA: Diagnosis not present

## 2016-11-26 DIAGNOSIS — K219 Gastro-esophageal reflux disease without esophagitis: Secondary | ICD-10-CM | POA: Diagnosis not present

## 2016-11-26 DIAGNOSIS — R109 Unspecified abdominal pain: Secondary | ICD-10-CM | POA: Diagnosis not present

## 2016-11-26 DIAGNOSIS — F329 Major depressive disorder, single episode, unspecified: Secondary | ICD-10-CM | POA: Insufficient documentation

## 2016-11-26 DIAGNOSIS — F17210 Nicotine dependence, cigarettes, uncomplicated: Secondary | ICD-10-CM | POA: Diagnosis not present

## 2016-11-26 DIAGNOSIS — Z79899 Other long term (current) drug therapy: Secondary | ICD-10-CM | POA: Diagnosis not present

## 2016-11-26 DIAGNOSIS — R102 Pelvic and perineal pain: Secondary | ICD-10-CM | POA: Diagnosis not present

## 2016-11-26 DIAGNOSIS — G8929 Other chronic pain: Secondary | ICD-10-CM | POA: Diagnosis not present

## 2016-11-26 DIAGNOSIS — Z90710 Acquired absence of both cervix and uterus: Secondary | ICD-10-CM | POA: Diagnosis not present

## 2016-11-26 DIAGNOSIS — J45909 Unspecified asthma, uncomplicated: Secondary | ICD-10-CM | POA: Diagnosis not present

## 2016-11-26 LAB — CBC WITH DIFFERENTIAL/PLATELET
BASOS PCT: 1 %
Basophils Absolute: 0.1 10*3/uL (ref 0.0–0.1)
Eosinophils Absolute: 0.6 10*3/uL (ref 0.0–0.7)
Eosinophils Relative: 5 %
HEMATOCRIT: 38.4 % (ref 36.0–46.0)
Hemoglobin: 12.5 g/dL (ref 12.0–15.0)
LYMPHS ABS: 3.4 10*3/uL (ref 0.7–4.0)
LYMPHS PCT: 28 %
MCH: 27.2 pg (ref 26.0–34.0)
MCHC: 32.6 g/dL (ref 30.0–36.0)
MCV: 83.7 fL (ref 78.0–100.0)
MONO ABS: 0.7 10*3/uL (ref 0.1–1.0)
Monocytes Relative: 6 %
NEUTROS ABS: 7.4 10*3/uL (ref 1.7–7.7)
Neutrophils Relative %: 60 %
Platelets: 336 10*3/uL (ref 150–400)
RBC: 4.59 MIL/uL (ref 3.87–5.11)
RDW: 15.6 % — ABNORMAL HIGH (ref 11.5–15.5)
WBC: 12.2 10*3/uL — AB (ref 4.0–10.5)

## 2016-11-26 LAB — URINALYSIS, ROUTINE W REFLEX MICROSCOPIC
BILIRUBIN URINE: NEGATIVE
Glucose, UA: NEGATIVE mg/dL
Hgb urine dipstick: NEGATIVE
Ketones, ur: NEGATIVE mg/dL
Leukocytes, UA: NEGATIVE
NITRITE: NEGATIVE
PH: 6 (ref 5.0–8.0)
Protein, ur: NEGATIVE mg/dL
SPECIFIC GRAVITY, URINE: 1.011 (ref 1.005–1.030)

## 2016-11-26 LAB — POCT PREGNANCY, URINE: PREG TEST UR: NEGATIVE

## 2016-11-26 MED ORDER — ONDANSETRON 4 MG PO TBDP
4.0000 mg | ORAL_TABLET | Freq: Once | ORAL | Status: AC
  Administered 2016-11-26: 4 mg via ORAL
  Filled 2016-11-26: qty 1

## 2016-11-26 MED ORDER — NAPROXEN 500 MG PO TABS: 500.0000 mg | ORAL_TABLET | Freq: Two times a day (BID) | ORAL | 0 refills | Status: AC | PRN

## 2016-11-26 MED ORDER — KETOROLAC TROMETHAMINE 60 MG/2ML IM SOLN
60.0000 mg | Freq: Once | INTRAMUSCULAR | Status: AC
  Administered 2016-11-26: 60 mg via INTRAMUSCULAR
  Filled 2016-11-26: qty 2

## 2016-11-26 NOTE — MAU Provider Note (Signed)
History     CSN: [id number]  Arrival date and time: 11/26/16 1713  First Provider Initiated Contact with Patient 11/26/16 2049      Chief Complaint  Patient presents with  . Abdominal Pain   HPI Jill Bruce is a 36 y.o. [id number] female who presents with abdominal pain. Reports chronic intermittent abdominal pain for the last year. Pain is primarily in the LLQ although sometimes occurs in RLQ. Describes as sharp pain. Rates pain 10/10. Has been taking percocet & ibuprofen without relief. Endorses nausea that she states is due to the pain. Denies fever/chills, vomiting, diarrhea, constipation, vaginal bleeding, or vaginal discharge. Last BM was this morning. Hx of partial hysterectomy per patient. Had an ultrasound at the [place] last week; unable to visualize left ovary.   Past Medical History:  Diagnosis Date  . Allergy   . Anxiety   . Arthritis   . Asthma   . Avascular necrosis (HCC)   . Depression   . Fibromyalgia   . GERD (gastroesophageal reflux disease)   . Migraine   . Post traumatic stress disorder (PTSD)     Past Surgical History:  Procedure Laterality Date  . ABDOMINAL HYSTERECTOMY     2012  . BLADDER SUSPENSION     2012  . CHOLECYSTECTOMY     2009  . FOOT SURGERY     2011  . JOINT REPLACEMENT     Right Hip and Right knee - both 2015  . TONSILLECTOMY    . TUBAL LIGATION     2012    Family History  Problem Relation Age of Onset  . COPD Father   . Heart disease Father   . Stroke Father   . Heart attack Father   . Sleep apnea Father   . Kidney failure Father   . Diabetes Other        Paternal aunt and uncle  . Alzheimer's disease Paternal Grandmother     Social History  Substance Use Topics  . Smoking status: Current Every Day Smoker    Packs/day: 0.50    Types: Cigarettes    Last attempt to quit: 05/09/2008  . Smokeless tobacco: Never Used  . Alcohol use No    Allergies:  Allergies  Allergen Reactions  . Varenicline Rash  .  Augmentin [Amoxicillin-Pot Clavulanate] Rash  . Latex Rash  . Tape Other (See Comments)    Blisters- plastic    Prescriptions Prior to Admission  Medication Sig Dispense Refill Last Dose  . albuterol (PROVENTIL HFA;VENTOLIN HFA) 108 (90 BASE) MCG/ACT inhaler Inhale into the lungs every 6 (six) hours as needed for wheezing or shortness of breath.   Past Month at Unknown time  . ALPRAZolam (XANAX) 1 MG tablet Take 1 mg by mouth 3 (three) times daily as needed for anxiety or sleep.   1 11/25/2016 at Unknown time  . budesonide-formoterol (SYMBICORT) 80-4.5 MCG/ACT inhaler Inhale 2 puffs into the lungs 2 (two) times daily.   11/26/2016 at Unknown time  . cetirizine (ZYRTEC) 10 MG tablet Take 10 mg by mouth daily.   11/25/2016 at Unknown time  . fexofenadine (ALLEGRA) 180 MG tablet Take 180 mg by mouth daily.   11/26/2016 at Unknown time  . flunisolide (NASALIDE) 25 MCG/ACT (0.025%) SOLN Place 2 sprays into the nose 2 (two) times daily.    Past Month at Unknown time  . HYDROcodone-acetaminophen (NORCO) 10-325 MG per tablet Take 1 tablet by mouth every 6 (six) hours as needed.   11/25/2016  at Unknown time  . ibuprofen (ADVIL,MOTRIN) 800 MG tablet Take 800 mg by mouth 2 (two) times daily.   11/26/2016 at Unknown time  . montelukast (SINGULAIR) 10 MG tablet Take 10 mg by mouth at bedtime.   11/25/2016 at Unknown time  . pantoprazole (PROTONIX) 40 MG tablet Take 40 mg by mouth daily.   11/26/2016 at Unknown time  . TRINTELLIX 20 MG TABS Take 20 mg by mouth daily.    11/26/2016 at Unknown time  . naproxen (NAPROSYN) 500 MG tablet Take 1 tablet (500 mg total) by mouth 2 (two) times daily as needed for mild pain, moderate pain or headache (TAKE WITH MEALS.). 20 tablet 0 Taking  . olopatadine (PATANOL) 0.1 % ophthalmic solution Place 1 drop into both eyes 2 (two) times daily. 5 mL 5 not filled yet    Review of Systems  Constitutional: Negative.   Gastrointestinal: Positive for abdominal pain and nausea. Negative  for constipation, diarrhea and vomiting.  Genitourinary: Negative.    Physical Exam   Blood pressure (!) 110/57, pulse 60, temperature 97.9 F (36.6 C), temperature source Oral, resp. rate 18.  Physical Exam  Nursing note and vitals reviewed. Constitutional: She is oriented to person, place, and time. She appears well-developed and well-nourished. No distress.  HENT:  Head: Normocephalic and atraumatic.  Eyes: Conjunctivae are normal. Right eye exhibits no discharge. Left eye exhibits no discharge. No scleral icterus.  Neck: Normal range of motion.  Cardiovascular: Normal rate, regular rhythm and normal heart sounds.   No murmur heard. Respiratory: Effort normal and breath sounds normal. No respiratory distress. She has no wheezes.  GI: Soft. Bowel sounds are normal. She exhibits no distension and no mass. There is tenderness in the left lower quadrant. There is no rigidity, no rebound and no guarding.  Neurological: She is alert and oriented to person, place, and time.  Skin: Skin is warm and dry. She is not diaphoretic.  Psychiatric: She has a normal mood and affect. Her behavior is normal. Judgment and thought content normal.    MAU Course  Procedures Results for orders placed or performed during the hospital encounter of 11/26/16 (from the past 24 hour(s))  Urinalysis, Routine w reflex microscopic     Status: None   Collection Time: 11/26/16  6:30 PM  Result Value Ref Range   Color, Urine YELLOW YELLOW   APPearance CLEAR CLEAR   Specific Gravity, Urine 1.011 1.005 - 1.030   pH 6.0 5.0 - 8.0   Glucose, UA NEGATIVE NEGATIVE mg/dL   Hgb urine dipstick NEGATIVE NEGATIVE   Bilirubin Urine NEGATIVE NEGATIVE   Ketones, ur NEGATIVE NEGATIVE mg/dL   Protein, ur NEGATIVE NEGATIVE mg/dL   Nitrite NEGATIVE NEGATIVE   Leukocytes, UA NEGATIVE NEGATIVE  Pregnancy, urine POC     Status: None   Collection Time: 11/26/16  8:03 PM  Result Value Ref Range   Preg Test, Ur NEGATIVE NEGATIVE   CBC with Differential/Platelet     Status: Abnormal   Collection Time: 11/26/16  8:31 PM  Result Value Ref Range   WBC 12.2 (H) 4.0 - 10.5 K/uL   RBC 4.59 3.87 - 5.11 MIL/uL   Hemoglobin 12.5 12.0 - 15.0 g/dL   HCT 16.1 09.6 - 04.5 %   MCV 83.7 78.0 - 100.0 fL   MCH 27.2 26.0 - 34.0 pg   MCHC 32.6 30.0 - 36.0 g/dL   RDW 40.9 (H) 81.1 - 91.4 %   Platelets 336 150 - 400 K/uL  Neutrophils Relative % 60 %   Neutro Abs 7.4 1.7 - 7.7 K/uL   Lymphocytes Relative 28 %   Lymphs Abs 3.4 0.7 - 4.0 K/uL   Monocytes Relative 6 %   Monocytes Absolute 0.7 0.1 - 1.0 K/uL   Eosinophils Relative 5 %   Eosinophils Absolute 0.6 0.0 - 0.7 K/uL   Basophils Relative 1 %   Basophils Absolute 0.1 0.0 - 0.1 K/uL   [place] Transvaginal Non-ob  Result Date: 11/26/2016 CLINICAL DATA:  Left-sided pain history of hysterectomy EXAM: TRANSABDOMINAL AND TRANSVAGINAL ULTRASOUND OF PELVIS TECHNIQUE: Both transabdominal and transvaginal ultrasound examinations of the pelvis were performed. Transabdominal technique was performed for global imaging of the pelvis including uterus, ovaries, adnexal regions, and pelvic cul-de-sac. It was necessary to proceed with endovaginal exam following the transabdominal exam to visualize the ovaries. COMPARISON:  08/01/2015 FINDINGS: Uterus Surgically absent Endometrium Surgically absent Right ovary Measurements: 3 x 2.8 x 3.1 cm. Normal appearance. Dominant follicle/ cyst measuring less than 3 cm. Left ovary Not visualized Other findings Trace free fluid in the pelvis. IMPRESSION: 1. Surgical absence of the uterus 2. Nonvisualization of the left ovary 3. Trace free fluid in the pelvis Electronically Signed   By: Jasmine PangKim  Fujinaga M.D.   On: 11/26/2016 21:38   [place] Pelvis Complete  Result Date: 11/26/2016 CLINICAL DATA:  Left-sided pain history of hysterectomy EXAM: TRANSABDOMINAL AND TRANSVAGINAL ULTRASOUND OF PELVIS TECHNIQUE: Both transabdominal and transvaginal ultrasound examinations of the  pelvis were performed. Transabdominal technique was performed for global imaging of the pelvis including uterus, ovaries, adnexal regions, and pelvic cul-de-sac. It was necessary to proceed with endovaginal exam following the transabdominal exam to visualize the ovaries. COMPARISON:  08/01/2015 FINDINGS: Uterus Surgically absent Endometrium Surgically absent Right ovary Measurements: 3 x 2.8 x 3.1 cm. Normal appearance. Dominant follicle/ cyst measuring less than 3 cm. Left ovary Not visualized Other findings Trace free fluid in the pelvis. IMPRESSION: 1. Surgical absence of the uterus 2. Nonvisualization of the left ovary 3. Trace free fluid in the pelvis Electronically Signed   By: Jasmine PangKim  Fujinaga M.D.   On: 11/26/2016 21:38     MDM UPT negative (prior to knowing hysterectomy hx) CBC -- elevated WBC, no left shift Per Controlled substance database pt has received percocet & xanax monthly since last year (except for March of this year).  Zofran 4 mg ODT & toradol 60 mg IM Ultrasound ordered -- unable to visualize left ovary S/w Dr. Henderson CloudHorvath. Discharge home. Pt to schedule f/u with office  Assessment and Plan  A: 1. Chronic abdominal pain   2. Acute pelvic pain, female    P; Discharge home Rx naproxen [facility] Discussed reasons to return to MAU vs ED  Judeth HornErin Lawrence 11/26/2016, 8:49 PM

## 2016-11-26 NOTE — Discharge Instructions (Signed)

## 2016-11-26 NOTE — MAU Note (Signed)
Pt having left sided pain (ovary) 10/10. [place] [facility] [place] [facility], they said they couldn't see her ovary.

## 2016-12-09 ENCOUNTER — Ambulatory Visit (INDEPENDENT_AMBULATORY_CARE_PROVIDER_SITE_OTHER): Payer: Medicare PPO | Admitting: *Deleted

## 2016-12-09 DIAGNOSIS — J309 Allergic rhinitis, unspecified: Secondary | ICD-10-CM | POA: Diagnosis not present

## 2016-12-09 DIAGNOSIS — R1032 Left lower quadrant pain: Secondary | ICD-10-CM | POA: Diagnosis not present

## 2016-12-09 DIAGNOSIS — Z6839 Body mass index (BMI) 39.0-39.9, adult: Secondary | ICD-10-CM | POA: Diagnosis not present

## 2016-12-09 MED ORDER — EPINEPHRINE 0.3 MG/0.3ML IJ SOAJ: 0.3000 mg | Freq: Once | INTRAMUSCULAR | 1 refills | Status: AC

## 2016-12-09 NOTE — Progress Notes (Signed)
Patient started allergy injections at 0.05 dosage each 1- Grass-Cat-Dog and 1 Dmite-Weed. Reviewed Schedule B 1-2 times weekly, allergy injection hours and side effects. Reviewed how and when to use epipen. RX  [facility]. Patient had no problems.

## 2016-12-10 ENCOUNTER — Ambulatory Visit: Payer: Medicare PPO

## 2016-12-14 ENCOUNTER — Ambulatory Visit (INDEPENDENT_AMBULATORY_CARE_PROVIDER_SITE_OTHER): Payer: Medicare PPO | Admitting: *Deleted

## 2016-12-14 DIAGNOSIS — J309 Allergic rhinitis, unspecified: Secondary | ICD-10-CM | POA: Diagnosis not present

## 2016-12-21 ENCOUNTER — Ambulatory Visit (INDEPENDENT_AMBULATORY_CARE_PROVIDER_SITE_OTHER): Payer: Medicare PPO | Admitting: *Deleted

## 2016-12-21 DIAGNOSIS — J309 Allergic rhinitis, unspecified: Secondary | ICD-10-CM

## 2016-12-24 ENCOUNTER — Ambulatory Visit (INDEPENDENT_AMBULATORY_CARE_PROVIDER_SITE_OTHER): Payer: Medicare PPO

## 2016-12-24 DIAGNOSIS — J309 Allergic rhinitis, unspecified: Secondary | ICD-10-CM | POA: Diagnosis not present

## 2016-12-28 ENCOUNTER — Ambulatory Visit (INDEPENDENT_AMBULATORY_CARE_PROVIDER_SITE_OTHER): Payer: Medicare PPO | Admitting: *Deleted

## 2016-12-28 DIAGNOSIS — J309 Allergic rhinitis, unspecified: Secondary | ICD-10-CM | POA: Diagnosis not present

## 2017-01-11 ENCOUNTER — Ambulatory Visit (INDEPENDENT_AMBULATORY_CARE_PROVIDER_SITE_OTHER): Payer: Medicare PPO | Admitting: *Deleted

## 2017-01-11 DIAGNOSIS — J309 Allergic rhinitis, unspecified: Secondary | ICD-10-CM | POA: Diagnosis not present

## 2017-03-01 DIAGNOSIS — M25562 Pain in left knee: Secondary | ICD-10-CM | POA: Diagnosis not present

## 2017-03-01 DIAGNOSIS — M25552 Pain in left hip: Secondary | ICD-10-CM | POA: Diagnosis not present

## 2017-03-17 DIAGNOSIS — S83002D Unspecified subluxation of left patella, subsequent encounter: Secondary | ICD-10-CM | POA: Diagnosis not present

## 2017-03-17 DIAGNOSIS — M25562 Pain in left knee: Secondary | ICD-10-CM | POA: Diagnosis not present

## 2017-03-17 DIAGNOSIS — R262 Difficulty in walking, not elsewhere classified: Secondary | ICD-10-CM | POA: Diagnosis not present

## 2017-03-19 DIAGNOSIS — R262 Difficulty in walking, not elsewhere classified: Secondary | ICD-10-CM | POA: Diagnosis not present

## 2017-03-19 DIAGNOSIS — M25562 Pain in left knee: Secondary | ICD-10-CM | POA: Diagnosis not present

## 2017-03-19 DIAGNOSIS — S83002D Unspecified subluxation of left patella, subsequent encounter: Secondary | ICD-10-CM | POA: Diagnosis not present

## 2017-03-23 DIAGNOSIS — M25562 Pain in left knee: Secondary | ICD-10-CM | POA: Diagnosis not present

## 2017-03-23 DIAGNOSIS — R262 Difficulty in walking, not elsewhere classified: Secondary | ICD-10-CM | POA: Diagnosis not present

## 2017-03-23 DIAGNOSIS — S83002D Unspecified subluxation of left patella, subsequent encounter: Secondary | ICD-10-CM | POA: Diagnosis not present

## 2017-03-25 DIAGNOSIS — M25562 Pain in left knee: Secondary | ICD-10-CM | POA: Diagnosis not present

## 2017-03-25 DIAGNOSIS — R262 Difficulty in walking, not elsewhere classified: Secondary | ICD-10-CM | POA: Diagnosis not present

## 2017-03-25 DIAGNOSIS — S83002D Unspecified subluxation of left patella, subsequent encounter: Secondary | ICD-10-CM | POA: Diagnosis not present

## 2017-04-01 DIAGNOSIS — R262 Difficulty in walking, not elsewhere classified: Secondary | ICD-10-CM | POA: Diagnosis not present

## 2017-04-01 DIAGNOSIS — M25562 Pain in left knee: Secondary | ICD-10-CM | POA: Diagnosis not present

## 2017-04-01 DIAGNOSIS — S83002D Unspecified subluxation of left patella, subsequent encounter: Secondary | ICD-10-CM | POA: Diagnosis not present

## 2017-04-05 DIAGNOSIS — S83002D Unspecified subluxation of left patella, subsequent encounter: Secondary | ICD-10-CM | POA: Diagnosis not present

## 2017-04-05 DIAGNOSIS — M25562 Pain in left knee: Secondary | ICD-10-CM | POA: Diagnosis not present

## 2017-04-05 DIAGNOSIS — R262 Difficulty in walking, not elsewhere classified: Secondary | ICD-10-CM | POA: Diagnosis not present

## 2017-04-08 DIAGNOSIS — M25562 Pain in left knee: Secondary | ICD-10-CM | POA: Diagnosis not present

## 2017-04-08 DIAGNOSIS — M25552 Pain in left hip: Secondary | ICD-10-CM | POA: Diagnosis not present

## 2017-04-12 ENCOUNTER — Encounter (HOSPITAL_COMMUNITY): Payer: Self-pay

## 2017-04-12 DIAGNOSIS — Z79899 Other long term (current) drug therapy: Secondary | ICD-10-CM | POA: Insufficient documentation

## 2017-04-12 DIAGNOSIS — F17210 Nicotine dependence, cigarettes, uncomplicated: Secondary | ICD-10-CM | POA: Insufficient documentation

## 2017-04-12 DIAGNOSIS — G43009 Migraine without aura, not intractable, without status migrainosus: Secondary | ICD-10-CM | POA: Diagnosis not present

## 2017-04-12 DIAGNOSIS — G43909 Migraine, unspecified, not intractable, without status migrainosus: Secondary | ICD-10-CM | POA: Diagnosis not present

## 2017-04-12 DIAGNOSIS — J45909 Unspecified asthma, uncomplicated: Secondary | ICD-10-CM | POA: Diagnosis not present

## 2017-04-12 NOTE — ED Triage Notes (Signed)
Pt states that she began to have a migraine several hours ago. C/o of nausea, photophobia and noise.

## 2017-04-13 ENCOUNTER — Emergency Department (HOSPITAL_COMMUNITY)
Admission: EM | Admit: 2017-04-13 | Discharge: 2017-04-13 | Disposition: A | Discharge status: Home / Self Care | Payer: Medicare PPO | Attending: Emergency Medicine | Admitting: Emergency Medicine

## 2017-04-13 DIAGNOSIS — G43009 Migraine without aura, not intractable, without status migrainosus: Secondary | ICD-10-CM

## 2017-04-13 MED ORDER — METOCLOPRAMIDE HCL 5 MG/ML IJ SOLN
10.0000 mg | Freq: Once | INTRAMUSCULAR | Status: AC
  Administered 2017-04-13: 10 mg via INTRAVENOUS
  Filled 2017-04-13: qty 2

## 2017-04-13 MED ORDER — DIPHENHYDRAMINE HCL 50 MG/ML IJ SOLN
12.5000 mg | Freq: Once | INTRAMUSCULAR | Status: AC
  Administered 2017-04-13: 12.5 mg via INTRAVENOUS
  Filled 2017-04-13: qty 1

## 2017-04-13 MED ORDER — KETOROLAC TROMETHAMINE 15 MG/ML IJ SOLN
15.0000 mg | Freq: Once | INTRAMUSCULAR | Status: AC
  Administered 2017-04-13: 15 mg via INTRAVENOUS
  Filled 2017-04-13: qty 1

## 2017-04-13 NOTE — ED Provider Notes (Signed)
[facility]-EMERGENCY DEPT Provider Note   CSN: [id number] Arrival date & time: 04/12/17  2043     History   Chief Complaint Chief Complaint  Patient presents with  . Migraine    HPI Jill Bruce is a 36 y.o. female.  Patient with a history of migraine headaches presents with symptoms typical of a migraine episode. She has associated nausea, photophobia and noise sensitivity. No fever, URI symptoms, chest pain, SOB or dysuria. Usual remedies have been unsuccessful.       Past Medical History:  Diagnosis Date  . Allergy   . Anxiety   . Arthritis   . Asthma   . Avascular necrosis (HCC)   . Depression   . Fibromyalgia   . GERD (gastroesophageal reflux disease)   . Migraine   . Post traumatic stress disorder (PTSD)     Patient Active Problem List   Diagnosis Date Noted  . Migraine 07/18/2015  . Obesity, Class III, BMI 40-49.9 (morbid obesity) (HCC) 12/07/2013    Past Surgical History:  Procedure Laterality Date  . ABDOMINAL HYSTERECTOMY     2012  . BLADDER SUSPENSION     2012  . CHOLECYSTECTOMY     2009  . FOOT SURGERY     2011  . JOINT REPLACEMENT     Right Hip and Right knee - both 2015  . TONSILLECTOMY    . TUBAL LIGATION     2012    OB History    Gravida Para Term Preterm AB Living   SAB TAB Ectopic Multiple Live Births                   Home Medications    Prior to Admission medications   Medication Sig Start Date End Date Taking? Authorizing Provider  albuterol (PROVENTIL HFA;VENTOLIN HFA) 108 (90 BASE) MCG/ACT inhaler Inhale into the lungs every 6 (six) hours as needed for wheezing or shortness of breath.    [provider]  ALPRAZolam Prudy Feeler) 1 MG tablet Take 1 mg by mouth 3 (three) times daily as needed for anxiety or sleep.  05/16/15   [provider]  budesonide-formoterol (SYMBICORT) 80-4.5 MCG/ACT inhaler Inhale 2 puffs into the lungs 2 (two) times daily.    [provider]  cetirizine  (ZYRTEC) 10 MG tablet Take 10 mg by mouth daily.    [provider]  fexofenadine (ALLEGRA) 180 MG tablet Take 180 mg by mouth daily.    [provider]  flunisolide (NASALIDE) 25 MCG/ACT (0.025%) SOLN Place 2 sprays into the nose 2 (two) times daily.     [provider]  HYDROcodone-acetaminophen (NORCO) 10-325 MG per tablet Take 1 tablet by mouth every 6 (six) hours as needed.    [provider]  montelukast (SINGULAIR) 10 MG tablet Take 10 mg by mouth at bedtime.    [provider]  naproxen (NAPROSYN) 500 MG tablet Take 1 tablet (500 mg total) by mouth 2 (two) times daily as needed. 11/26/16   Judeth Horn, NP  olopatadine (PATANOL) 0.1 % ophthalmic solution Place 1 drop into both eyes 2 (two) times daily. 11/13/16   Kozlow, Alvira Philips, MD  pantoprazole (PROTONIX) 40 MG tablet Take 40 mg by mouth daily.    [provider]  TRINTELLIX 20 MG TABS Take 20 mg by mouth daily.  08/17/16   [provider]    Family History Family History  Problem Relation Age of  Onset  . COPD Father   . Heart disease Father   . Stroke Father   . Heart attack Father   . Sleep apnea Father   . Kidney failure Father   . Diabetes Other        Paternal aunt and uncle  . Alzheimer's disease Paternal Grandmother     Social History Social History  Substance Use Topics  . Smoking status: Current Every Day Smoker    Packs/day: 0.50    Types: Cigarettes    Last attempt to quit: 05/09/2008  . Smokeless tobacco: Never Used  . Alcohol use No     Allergies   Varenicline; Augmentin [amoxicillin-pot clavulanate]; Latex; and Tape   Review of Systems Review of Systems  Constitutional: Negative for chills and fever.  HENT: Negative.   Eyes: Positive for photophobia.  Respiratory: Negative.   Cardiovascular: Negative.   Gastrointestinal: Positive for nausea. Negative for vomiting.  Genitourinary: Negative.   Musculoskeletal: Negative.   Skin:  Negative.   Neurological: Positive for headaches. Negative for syncope, facial asymmetry, speech difficulty and weakness.     Physical Exam Updated Vital Signs BP 109/77   Pulse (!) 55   Temp 98.8 F (37.1 C) (Oral)   Resp 16   SpO2 98%   Physical Exam  Constitutional: She is oriented to person, place, and time. She appears well-developed and well-nourished.  HENT:  Head: Normocephalic and atraumatic.  Eyes: Pupils are equal, round, and reactive to light.  Neck: Normal range of motion. Neck supple.  Cardiovascular: Normal rate and regular rhythm.   Pulmonary/Chest: Effort normal and breath sounds normal.  Abdominal: Soft. Bowel sounds are normal. There is no tenderness. There is no rebound and no guarding.  Musculoskeletal: Normal range of motion. She exhibits no edema.  Neurological: She is alert and oriented to person, place, and time. She has normal strength and normal reflexes. No sensory deficit. She displays a negative Romberg sign. Coordination normal.  CN's 3-12 grossly intact. Speech is clear and focused. No facial asymmetry. No lateralizing weakness. Reflexes are equal. No deficits of coordination. Ambulatory without imbalance.    Skin: Skin is warm and dry. No rash noted.  Psychiatric: She has a normal mood and affect.     ED Treatments / Results  Labs (all labs ordered are listed, but only abnormal results are displayed) Labs Reviewed - No data to display  EKG  EKG Interpretation None       Radiology No results found.  Procedures Procedures (including critical care time)  Medications Ordered in ED Medications  metoCLOPramide (REGLAN) injection 10 mg (10 mg Intravenous Given 04/13/17 0103)  diphenhydrAMINE (BENADRYL) injection 12.5 mg (12.5 mg Intravenous Given 04/13/17 0103)  ketorolac (TORADOL) 15 MG/ML injection 15 mg (15 mg Intravenous Given 04/13/17 0102)     Initial Impression / Assessment and Plan / ED Course  I have reviewed the triage vital  signs and the nursing notes.  Pertinent labs & imaging results that were available during my care of the patient were reviewed by me and considered in my medical decision making (see chart for details).     Patient presents with HA that is typical of a migraine for her. No fever, rash.   She is given a headache cocktail with complete resolution of headache pain. No change in exam. VSS. She reports she is ready to go home.   Final Clinical Impressions(s) / ED Diagnoses   Final diagnoses:  None   1. Migraine HA  New Prescriptions New Prescriptions   No medications on file     Elpidio Anis, Cordelia Poche 04/13/17 0149    Derwood Kaplan, MD 04/13/17 [phone]

## 2017-04-29 DIAGNOSIS — F4310 Post-traumatic stress disorder, unspecified: Secondary | ICD-10-CM | POA: Diagnosis not present

## 2017-05-21 DIAGNOSIS — Z91040 Latex allergy status: Secondary | ICD-10-CM | POA: Diagnosis not present

## 2017-05-21 DIAGNOSIS — M2352 Chronic instability of knee, left knee: Secondary | ICD-10-CM | POA: Diagnosis not present

## 2017-05-21 DIAGNOSIS — J45909 Unspecified asthma, uncomplicated: Secondary | ICD-10-CM | POA: Diagnosis not present

## 2017-05-21 DIAGNOSIS — F419 Anxiety disorder, unspecified: Secondary | ICD-10-CM | POA: Diagnosis not present

## 2017-05-21 DIAGNOSIS — Z96652 Presence of left artificial knee joint: Secondary | ICD-10-CM | POA: Diagnosis not present

## 2017-05-21 DIAGNOSIS — F410 Panic disorder [episodic paroxysmal anxiety] without agoraphobia: Secondary | ICD-10-CM | POA: Diagnosis not present

## 2017-05-21 DIAGNOSIS — F4310 Post-traumatic stress disorder, unspecified: Secondary | ICD-10-CM | POA: Diagnosis not present

## 2017-05-21 DIAGNOSIS — G4733 Obstructive sleep apnea (adult) (pediatric): Secondary | ICD-10-CM | POA: Diagnosis not present

## 2017-05-21 DIAGNOSIS — T84023A Instability of internal left knee prosthesis, initial encounter: Secondary | ICD-10-CM | POA: Diagnosis not present

## 2017-05-21 DIAGNOSIS — Z471 Aftercare following joint replacement surgery: Secondary | ICD-10-CM | POA: Diagnosis not present

## 2017-05-21 DIAGNOSIS — Z881 Allergy status to other antibiotic agents status: Secondary | ICD-10-CM | POA: Diagnosis not present

## 2017-05-21 DIAGNOSIS — M238X9 Other internal derangements of unspecified knee: Secondary | ICD-10-CM | POA: Diagnosis not present

## 2017-06-07 DIAGNOSIS — T84023A Instability of internal left knee prosthesis, initial encounter: Secondary | ICD-10-CM | POA: Diagnosis not present

## 2017-06-07 DIAGNOSIS — M25562 Pain in left knee: Secondary | ICD-10-CM | POA: Diagnosis not present

## 2017-06-07 DIAGNOSIS — Z471 Aftercare following joint replacement surgery: Secondary | ICD-10-CM | POA: Diagnosis not present

## 2017-06-15 DIAGNOSIS — M25562 Pain in left knee: Secondary | ICD-10-CM | POA: Diagnosis not present

## 2017-06-15 DIAGNOSIS — M222X2 Patellofemoral disorders, left knee: Secondary | ICD-10-CM | POA: Diagnosis not present

## 2017-06-15 DIAGNOSIS — R262 Difficulty in walking, not elsewhere classified: Secondary | ICD-10-CM | POA: Diagnosis not present

## 2017-06-15 DIAGNOSIS — M2352 Chronic instability of knee, left knee: Secondary | ICD-10-CM | POA: Diagnosis not present

## 2017-06-16 DIAGNOSIS — R262 Difficulty in walking, not elsewhere classified: Secondary | ICD-10-CM | POA: Diagnosis not present

## 2017-06-16 DIAGNOSIS — M2352 Chronic instability of knee, left knee: Secondary | ICD-10-CM | POA: Diagnosis not present

## 2017-06-16 DIAGNOSIS — M25562 Pain in left knee: Secondary | ICD-10-CM | POA: Diagnosis not present

## 2017-06-16 DIAGNOSIS — M222X2 Patellofemoral disorders, left knee: Secondary | ICD-10-CM | POA: Diagnosis not present

## 2017-06-17 DIAGNOSIS — F4310 Post-traumatic stress disorder, unspecified: Secondary | ICD-10-CM | POA: Diagnosis not present

## 2017-06-22 DIAGNOSIS — R262 Difficulty in walking, not elsewhere classified: Secondary | ICD-10-CM | POA: Diagnosis not present

## 2017-06-22 DIAGNOSIS — M2352 Chronic instability of knee, left knee: Secondary | ICD-10-CM | POA: Diagnosis not present

## 2017-06-22 DIAGNOSIS — M222X2 Patellofemoral disorders, left knee: Secondary | ICD-10-CM | POA: Diagnosis not present

## 2017-06-22 DIAGNOSIS — M25562 Pain in left knee: Secondary | ICD-10-CM | POA: Diagnosis not present

## 2017-06-25 DIAGNOSIS — M25562 Pain in left knee: Secondary | ICD-10-CM | POA: Diagnosis not present

## 2017-09-30 DIAGNOSIS — Z471 Aftercare following joint replacement surgery: Secondary | ICD-10-CM | POA: Diagnosis not present

## 2017-09-30 DIAGNOSIS — M25562 Pain in left knee: Secondary | ICD-10-CM | POA: Diagnosis not present

## 2017-10-17 DIAGNOSIS — H66009 Acute suppurative otitis media without spontaneous rupture of ear drum, unspecified ear: Secondary | ICD-10-CM | POA: Diagnosis not present

## 2017-10-17 DIAGNOSIS — J0100 Acute maxillary sinusitis, unspecified: Secondary | ICD-10-CM | POA: Diagnosis not present

## 2017-10-24 DIAGNOSIS — L259 Unspecified contact dermatitis, unspecified cause: Secondary | ICD-10-CM | POA: Diagnosis not present

## 2017-11-24 DIAGNOSIS — Z7982 Long term (current) use of aspirin: Secondary | ICD-10-CM | POA: Diagnosis not present

## 2017-11-24 DIAGNOSIS — M25552 Pain in left hip: Secondary | ICD-10-CM | POA: Diagnosis not present

## 2017-11-24 DIAGNOSIS — F4310 Post-traumatic stress disorder, unspecified: Secondary | ICD-10-CM | POA: Diagnosis not present

## 2017-11-24 DIAGNOSIS — J45909 Unspecified asthma, uncomplicated: Secondary | ICD-10-CM | POA: Diagnosis not present

## 2017-11-24 DIAGNOSIS — K219 Gastro-esophageal reflux disease without esophagitis: Secondary | ICD-10-CM | POA: Diagnosis not present

## 2017-11-24 DIAGNOSIS — F17210 Nicotine dependence, cigarettes, uncomplicated: Secondary | ICD-10-CM | POA: Diagnosis not present

## 2017-11-24 DIAGNOSIS — F410 Panic disorder [episodic paroxysmal anxiety] without agoraphobia: Secondary | ICD-10-CM | POA: Diagnosis not present

## 2017-11-24 DIAGNOSIS — Z7951 Long term (current) use of inhaled steroids: Secondary | ICD-10-CM | POA: Diagnosis not present

## 2017-11-24 DIAGNOSIS — G8918 Other acute postprocedural pain: Secondary | ICD-10-CM | POA: Diagnosis not present

## 2017-11-24 DIAGNOSIS — Z471 Aftercare following joint replacement surgery: Secondary | ICD-10-CM | POA: Diagnosis not present

## 2017-11-24 DIAGNOSIS — Z96642 Presence of left artificial hip joint: Secondary | ICD-10-CM | POA: Diagnosis not present

## 2017-11-24 DIAGNOSIS — M87052 Idiopathic aseptic necrosis of left femur: Secondary | ICD-10-CM | POA: Diagnosis not present

## 2017-11-24 DIAGNOSIS — M87852 Other osteonecrosis, left femur: Secondary | ICD-10-CM | POA: Diagnosis not present

## 2017-11-24 DIAGNOSIS — G4733 Obstructive sleep apnea (adult) (pediatric): Secondary | ICD-10-CM | POA: Diagnosis not present

## 2017-12-09 DIAGNOSIS — M25552 Pain in left hip: Secondary | ICD-10-CM | POA: Diagnosis not present

## 2017-12-15 DIAGNOSIS — F4310 Post-traumatic stress disorder, unspecified: Secondary | ICD-10-CM | POA: Diagnosis not present

## 2018-01-05 DIAGNOSIS — M25552 Pain in left hip: Secondary | ICD-10-CM | POA: Diagnosis not present

## 2018-01-11 DIAGNOSIS — M25552 Pain in left hip: Secondary | ICD-10-CM | POA: Diagnosis not present

## 2018-01-21 DIAGNOSIS — M25552 Pain in left hip: Secondary | ICD-10-CM | POA: Diagnosis not present

## 2018-01-24 DIAGNOSIS — M25552 Pain in left hip: Secondary | ICD-10-CM | POA: Diagnosis not present

## 2018-01-31 DIAGNOSIS — M25552 Pain in left hip: Secondary | ICD-10-CM | POA: Diagnosis not present

## 2018-04-05 DIAGNOSIS — M25552 Pain in left hip: Secondary | ICD-10-CM | POA: Diagnosis not present

## 2018-04-05 DIAGNOSIS — M25562 Pain in left knee: Secondary | ICD-10-CM | POA: Diagnosis not present

## 2018-04-05 DIAGNOSIS — Z96642 Presence of left artificial hip joint: Secondary | ICD-10-CM | POA: Diagnosis not present

## 2018-04-05 DIAGNOSIS — Z471 Aftercare following joint replacement surgery: Secondary | ICD-10-CM | POA: Diagnosis not present

## 2018-04-11 DIAGNOSIS — J205 Acute bronchitis due to respiratory syncytial virus: Secondary | ICD-10-CM | POA: Diagnosis not present

## 2018-05-14 DIAGNOSIS — F4310 Post-traumatic stress disorder, unspecified: Secondary | ICD-10-CM | POA: Insufficient documentation

## 2018-05-14 DIAGNOSIS — F411 Generalized anxiety disorder: Secondary | ICD-10-CM

## 2018-06-01 ENCOUNTER — Ambulatory Visit (INDEPENDENT_AMBULATORY_CARE_PROVIDER_SITE_OTHER): Payer: Medicare PPO | Admitting: Physician Assistant

## 2018-06-01 ENCOUNTER — Encounter (INDEPENDENT_AMBULATORY_CARE_PROVIDER_SITE_OTHER): Payer: Self-pay

## 2018-06-01 ENCOUNTER — Encounter: Payer: Self-pay | Admitting: Physician Assistant

## 2018-06-01 DIAGNOSIS — F4310 Post-traumatic stress disorder, unspecified: Secondary | ICD-10-CM

## 2018-06-01 DIAGNOSIS — F411 Generalized anxiety disorder: Secondary | ICD-10-CM | POA: Diagnosis not present

## 2018-06-01 MED ORDER — QUETIAPINE FUMARATE 400 MG PO TABS: 400.0000 mg | ORAL_TABLET | Freq: Every day | ORAL | 1 refills | Status: DC

## 2018-06-01 MED ORDER — ALPRAZOLAM 1 MG PO TABS: 1.0000 mg | ORAL_TABLET | Freq: Three times a day (TID) | ORAL | 5 refills | Status: DC | PRN

## 2018-06-01 NOTE — Progress Notes (Signed)
[facility]  Patient ID: Jill Bruce,  MRN: [id number]  PCP: Abner GreenspanHodges, Beth, MD  Date of Evaluation: 06/01/2018 Time spent:15 minutes  Chief Complaint:  Chief Complaint    Follow-up      HISTORY/CURRENT STATUS: HPI Here for 6 month med check.  Patient denies loss of interest in usual activities and is able to enjoy things.  Denies decreased energy or motivation.  Appetite has not changed.  No extreme sadness, tearfulness, or feelings of hopelessness.  Denies any changes in concentration, making decisions or remembering things.  Denies suicidal or homicidal thoughts.  Does have nightmares every once in awhile. But usually sleeps well. Does have flashbacks occasionally, especially if she's triggered by something.  There's a commercial on tv about a dr. who isn't qualified to do a surgery.  It's advertising insurance.  That can cause horrible memories of the surgery she had.  (anesthesia wasn't given when she was having a knee replacement, but was paralyzed) The Seroquel still works though.    Anxiety is controlled most of the time.  Takes it at night to help with racing thoughts so she can go to sleep. Also needs it during the day and it works.    She's been put on Topamax for migraines.  It's helping some.    Individual Medical History/ Review of Systems: Changes? :No    Past medications for mental health diagnoses include: Ambien, Trintellix, Remeron, Xanax, Prazosin, Seroquel, Prozac, Wellbutrin, Contrave  Allergies: Chantix [varenicline tartrate]; Varenicline; Augmentin [amoxicillin-pot clavulanate]; Latex; and Tape  Current Medications:  Current Outpatient Medications:  .  albuterol (PROVENTIL HFA;VENTOLIN HFA) 108 (90 BASE) MCG/ACT inhaler, Inhale into the lungs every 6 (six) hours as needed for wheezing or shortness of breath., Disp: , Rfl:  .  ALPRAZolam (XANAX) 1 MG tablet, Take 1 mg by mouth 3 (three) times daily as needed for anxiety or sleep. ,  Disp: , Rfl: 1 .  budesonide-formoterol (SYMBICORT) 80-4.5 MCG/ACT inhaler, Inhale 2 puffs into the lungs 2 (two) times daily., Disp: , Rfl:  .  cetirizine (ZYRTEC) 10 MG tablet, Take 10 mg by mouth daily., Disp: , Rfl:  .  fexofenadine (ALLEGRA) 180 MG tablet, Take 180 mg by mouth daily., Disp: , Rfl:  .  flunisolide (NASALIDE) 25 MCG/ACT (0.025%) SOLN, Place 2 sprays into the nose 2 (two) times daily. , Disp: , Rfl:  .  montelukast (SINGULAIR) 10 MG tablet, Take 10 mg by mouth at bedtime., Disp: , Rfl:  .  naproxen (NAPROSYN) 500 MG tablet, Take 1 tablet (500 mg total) by mouth 2 (two) times daily as needed., Disp: 30 tablet, Rfl: 0 .  pantoprazole (PROTONIX) 40 MG tablet, Take 40 mg by mouth daily., Disp: , Rfl:  .  QUEtiapine (SEROQUEL) 400 MG tablet, Take 400 mg by mouth at bedtime., Disp: , Rfl:  .  topiramate (TOPAMAX) 100 MG tablet, Take 100 mg by mouth 2 (two) times daily., Disp: , Rfl:  .  HYDROcodone-acetaminophen (NORCO) 10-325 MG per tablet, Take 1 tablet by mouth every 6 (six) hours as needed., Disp: , Rfl:  .  olopatadine (PATANOL) 0.1 % ophthalmic solution, Place 1 drop into both eyes 2 (two) times daily., Disp: 5 mL, Rfl: 5 .  TRINTELLIX 20 MG TABS, Take 20 mg by mouth daily. , Disp: , Rfl:  Medication Side Effects: none  Family Medical/ Social History: Changes? No  MENTAL HEALTH EXAM:  There were no vitals taken for this visit.There is no height or weight  on file to calculate BMI.  General Appearance: Casual  Eye Contact:  Good  Speech:  Clear and Coherent  Volume:  Normal  Mood:  Euthymic  Affect:  Appropriate  Thought Process:  Goal Directed  Orientation:  Full (Time, Place, and Person)  Thought Content: Logical   Suicidal Thoughts:  No  Homicidal Thoughts:  No  Memory:  WNL  Judgement:  Good  Insight:  Good  Psychomotor Activity:  Normal  Concentration:  Concentration: Good  Recall:  Good  Fund of Knowledge: Good  Language: Good  Assets:  Desire for  Improvement  ADL's:  Intact  Cognition: WNL  Prognosis:  Good    DIAGNOSES:    ICD-10-CM   1. Posttraumatic stress disorder F43.10   2. Generalized anxiety disorder F41.1   Migraines, OA s/p multiple surgeries  Receiving Psychotherapy: No    RECOMMENDATIONS: Continue all current meds. Return in 6 months.    Melony Overly, PA-C

## 2018-07-19 DIAGNOSIS — E669 Obesity, unspecified: Secondary | ICD-10-CM | POA: Diagnosis not present

## 2018-07-19 DIAGNOSIS — K219 Gastro-esophageal reflux disease without esophagitis: Secondary | ICD-10-CM | POA: Diagnosis not present

## 2018-07-19 DIAGNOSIS — K5900 Constipation, unspecified: Secondary | ICD-10-CM | POA: Diagnosis not present

## 2018-07-19 DIAGNOSIS — G43909 Migraine, unspecified, not intractable, without status migrainosus: Secondary | ICD-10-CM | POA: Diagnosis not present

## 2018-07-19 DIAGNOSIS — J45909 Unspecified asthma, uncomplicated: Secondary | ICD-10-CM | POA: Diagnosis not present

## 2018-07-19 DIAGNOSIS — F418 Other specified anxiety disorders: Secondary | ICD-10-CM | POA: Diagnosis not present

## 2018-07-19 DIAGNOSIS — F4310 Post-traumatic stress disorder, unspecified: Secondary | ICD-10-CM | POA: Diagnosis not present

## 2018-07-19 DIAGNOSIS — G4700 Insomnia, unspecified: Secondary | ICD-10-CM | POA: Diagnosis not present

## 2018-07-19 DIAGNOSIS — F17200 Nicotine dependence, unspecified, uncomplicated: Secondary | ICD-10-CM | POA: Diagnosis not present

## 2018-08-05 DIAGNOSIS — R51 Headache: Secondary | ICD-10-CM | POA: Diagnosis not present

## 2018-08-05 DIAGNOSIS — S060X0A Concussion without loss of consciousness, initial encounter: Secondary | ICD-10-CM | POA: Diagnosis not present

## 2018-08-05 DIAGNOSIS — S0990XA Unspecified injury of head, initial encounter: Secondary | ICD-10-CM | POA: Diagnosis not present

## 2018-08-05 DIAGNOSIS — S199XXA Unspecified injury of neck, initial encounter: Secondary | ICD-10-CM | POA: Diagnosis not present

## 2018-08-05 DIAGNOSIS — H538 Other visual disturbances: Secondary | ICD-10-CM | POA: Diagnosis not present

## 2018-10-05 DIAGNOSIS — D320 Benign neoplasm of cerebral meninges: Secondary | ICD-10-CM | POA: Diagnosis not present

## 2018-11-21 DIAGNOSIS — M25562 Pain in left knee: Secondary | ICD-10-CM | POA: Diagnosis not present

## 2018-11-21 DIAGNOSIS — Z720 Tobacco use: Secondary | ICD-10-CM | POA: Diagnosis not present

## 2018-11-30 ENCOUNTER — Ambulatory Visit: Payer: Medicare PPO | Admitting: Physician Assistant

## 2018-11-30 ENCOUNTER — Other Ambulatory Visit: Payer: Self-pay

## 2018-12-01 ENCOUNTER — Other Ambulatory Visit: Payer: Self-pay

## 2018-12-01 ENCOUNTER — Encounter: Payer: Self-pay | Admitting: Physician Assistant

## 2018-12-01 ENCOUNTER — Ambulatory Visit (INDEPENDENT_AMBULATORY_CARE_PROVIDER_SITE_OTHER): Payer: Medicare PPO | Admitting: Physician Assistant

## 2018-12-01 DIAGNOSIS — F4310 Post-traumatic stress disorder, unspecified: Secondary | ICD-10-CM

## 2018-12-01 DIAGNOSIS — R37 Sexual dysfunction, unspecified: Secondary | ICD-10-CM | POA: Diagnosis not present

## 2018-12-01 DIAGNOSIS — F411 Generalized anxiety disorder: Secondary | ICD-10-CM | POA: Diagnosis not present

## 2018-12-01 DIAGNOSIS — G4700 Insomnia, unspecified: Secondary | ICD-10-CM

## 2018-12-01 MED ORDER — ALPRAZOLAM 1 MG PO TABS: 1.0000 mg | ORAL_TABLET | Freq: Three times a day (TID) | ORAL | 5 refills | Status: DC | PRN

## 2018-12-01 MED ORDER — QUETIAPINE FUMARATE 400 MG PO TABS: 400.0000 mg | ORAL_TABLET | Freq: Every day | ORAL | 1 refills | Status: DC

## 2018-12-01 MED ORDER — TRAZODONE HCL 50 MG PO TABS: 50.0000 mg | ORAL_TABLET | Freq: Every evening | ORAL | 1 refills | Status: DC | PRN

## 2018-12-01 NOTE — Progress Notes (Signed)
[facility]  Patient ID: Jill HopeMonica A Bruce,  MRN: [id number]  PCP: Abner GreenspanHodges, Beth, MD  Date of Evaluation: 12/01/2018 Time spent:25 minutes  Chief Complaint:  Chief Complaint    Post-Traumatic Stress Disorder; Follow-up     Virtual Visit via Telephone Note  I connected with patient by a video enabled telemedicine application or telephone, with their informed consent, and verified patient privacy and that I am speaking with the correct person using two identifiers.  I am private, in my home and the patient is home.   I discussed the limitations, risks, security and privacy concerns of performing an evaluation and management service by telephone and the availability of in person appointments. I also discussed with the patient that there may be a patient responsible charge related to this service. The patient expressed understanding and agreed to proceed.   I discussed the assessment and treatment plan with the patient. The patient was provided an opportunity to ask questions and all were answered. The patient agreed with the plan and demonstrated an understanding of the instructions.   The patient was advised to call back or seek an in-person evaluation if the symptoms worsen or if the condition fails to improve as anticipated.  I provided 25 minutes of non-face-to-face time during this encounter.  HISTORY/CURRENT STATUS: HPI For routine med check.  It's been a rough year so far (see ROS.) But better now. Has a meningioma that the neurosurgeon just wants to watch.  No need to go back till 2 years.   C/o decreased libido for about 6 months.  Not really sure why.  Is having trouble sleeping.  Mostly it is the fact that she cannot go to sleep.  She might lay awake in bed for 1 to 2 hours, even after taking the Seroquel.  Once she gets to sleep, she is able to stay asleep.  After she found out about the brain tumor, she was understandably anxious and not knowing what was  going to happen.  She feels much better after having talked with the neurosurgeon and knowing that they just want to watch it.  The anxiety has been controlled otherwise.  She does have panic attacks a couple of times a week which are relieved with the Xanax.  She is able to enjoy things.  Energy and motivation are good.  Not isolating any more than she has to due to the coronavirus pandemic.  Does not cry easily.  Denies suicidal or homicidal thoughts.  Patient denies increased energy with decreased need for sleep, no increased talkativeness, no racing thoughts, no impulsivity or risky behaviors, no increased spending, no increased libido, no grandiosity.  Denies dizziness, syncope, seizures, numbness, tingling, tremor, tics, unsteady gait, slurred speech, confusion.  She has chronic joint pain and stiffness, but no dystonia.  Individual Medical History/ Review of Systems: Changes? :Yes  Hit her head earlier this year and had concussion.  When had CT, found a benign brain tumor, will be watching it.  "It could have been there for a long time." Has severe constipation, had neg colonoscopy.  [facility].  Past medications for mental health diagnoses include: Ambien, Trintellix, Remeron, Xanax, Prazosin, Seroquel, Prozac, Wellbutrin, Contrave, Lunesta  Allergies: Chantix [varenicline tartrate]; Varenicline; Augmentin [amoxicillin-pot clavulanate]; Latex; and Tape  Current Medications:  Current Outpatient Medications:  .  albuterol (PROVENTIL HFA;VENTOLIN HFA) 108 (90 BASE) MCG/ACT inhaler, Inhale into the lungs every 6 (six) hours as needed for wheezing or shortness of  breath., Disp: , Rfl:  .  ALPRAZolam (XANAX) 1 MG tablet, Take 1 tablet (1 mg total) by mouth 3 (three) times daily as needed for anxiety or sleep., Disp: 90 tablet, Rfl: 5 .  budesonide-formoterol (SYMBICORT) 80-4.5 MCG/ACT inhaler, Inhale 2 puffs into the lungs 2 (two) times daily., Disp: , Rfl:  .   cetirizine (ZYRTEC) 10 MG tablet, Take 10 mg by mouth daily., Disp: , Rfl:  .  fexofenadine (ALLEGRA) 180 MG tablet, Take 180 mg by mouth daily., Disp: , Rfl:  .  montelukast (SINGULAIR) 10 MG tablet, Take 10 mg by mouth at bedtime., Disp: , Rfl:  .  naproxen (NAPROSYN) 500 MG tablet, Take 1 tablet (500 mg total) by mouth 2 (two) times daily as needed., Disp: 30 tablet, Rfl: 0 .  pantoprazole (PROTONIX) 40 MG tablet, Take 40 mg by mouth daily., Disp: , Rfl:  .  QUEtiapine (SEROQUEL) 400 MG tablet, Take 1 tablet (400 mg total) by mouth at bedtime., Disp: 90 tablet, Rfl: 1 .  topiramate (TOPAMAX) 100 MG tablet, Take 100 mg by mouth 2 (two) times daily. 1 am, 2 qhs, Disp: , Rfl:  .  flunisolide (NASALIDE) 25 MCG/ACT (0.025%) SOLN, Place 2 sprays into the nose 2 (two) times daily. , Disp: , Rfl:  .  LINZESS 145 MCG CAPS capsule, , Disp: , Rfl:  .  traZODone (DESYREL) 50 MG tablet, Take 1-2 tablets (50-100 mg total) by mouth at bedtime as needed for sleep., Disp: 60 tablet, Rfl: 1 Medication Side Effects: none  Family Medical/ Social History: Changes? No  MENTAL HEALTH EXAM:  There were no vitals taken for this visit.There is no height or weight on file to calculate BMI.  General Appearance: unable to assess  Eye Contact:  unable to assess  Speech:  Clear and Coherent  Volume:  Normal  Mood:  Euthymic  Affect:  unable to assess  Thought Process:  Goal Directed  Orientation:  Full (Time, Place, and Person)  Thought Content: Logical   Suicidal Thoughts:  No  Homicidal Thoughts:  No  Memory:  WNL  Judgement:  Good  Insight:  Good  Psychomotor Activity:  unable to assess  Concentration:  Concentration: Good  Recall:  Good  Fund of Knowledge: Good  Language: Good  Assets:  Desire for Improvement  ADL's:  Intact  Cognition: WNL  Prognosis:  Good    DIAGNOSES:    ICD-10-CM   1. Posttraumatic stress disorder F43.10   2. Generalized anxiety disorder F41.1   3. Insomnia, unspecified  type G47.00   4. Sexual dysfunction R37     Receiving Psychotherapy: No    RECOMMENDATIONS:  Continue Xanax 1 mg up to 3 times daily as needed. Continue Seroquel 400 mg nightly. Start trazodone 50 mg to 100 mg nightly as needed sleep. Continue Topamax 100 mg twice daily per neurology for migraines. We discussed the sexual dysfunction.  The medications that she is currently taking do not typically cause decreased libido.  I recommend she find a GYN and discuss that with them. Sleep hygiene was discussed. Return in 6 weeks.   Melony Overly, PA-C   This record has been created using [employer].  Chart creation errors have been sought, but may not always have been located and corrected. Such creation errors do not reflect on the standard of medical care.

## 2018-12-15 DIAGNOSIS — M25562 Pain in left knee: Secondary | ICD-10-CM | POA: Diagnosis not present

## 2018-12-21 DIAGNOSIS — M25562 Pain in left knee: Secondary | ICD-10-CM | POA: Diagnosis not present

## 2018-12-21 DIAGNOSIS — R937 Abnormal findings on diagnostic imaging of other parts of musculoskeletal system: Secondary | ICD-10-CM | POA: Diagnosis not present

## 2019-01-02 DIAGNOSIS — M25562 Pain in left knee: Secondary | ICD-10-CM | POA: Diagnosis not present

## 2019-01-11 DIAGNOSIS — M25562 Pain in left knee: Secondary | ICD-10-CM | POA: Diagnosis not present

## 2019-01-19 ENCOUNTER — Ambulatory Visit (INDEPENDENT_AMBULATORY_CARE_PROVIDER_SITE_OTHER): Payer: Medicare PPO | Admitting: Physician Assistant

## 2019-01-19 ENCOUNTER — Other Ambulatory Visit: Payer: Self-pay

## 2019-01-19 ENCOUNTER — Encounter: Payer: Self-pay | Admitting: Physician Assistant

## 2019-01-19 DIAGNOSIS — F331 Major depressive disorder, recurrent, moderate: Secondary | ICD-10-CM

## 2019-01-19 DIAGNOSIS — G4700 Insomnia, unspecified: Secondary | ICD-10-CM | POA: Diagnosis not present

## 2019-01-19 DIAGNOSIS — F411 Generalized anxiety disorder: Secondary | ICD-10-CM | POA: Diagnosis not present

## 2019-01-19 DIAGNOSIS — F4310 Post-traumatic stress disorder, unspecified: Secondary | ICD-10-CM | POA: Diagnosis not present

## 2019-01-19 DIAGNOSIS — R37 Sexual dysfunction, unspecified: Secondary | ICD-10-CM | POA: Diagnosis not present

## 2019-01-19 MED ORDER — TRAZODONE HCL 50 MG PO TABS: 50.0000 mg | ORAL_TABLET | Freq: Every evening | ORAL | 1 refills | Status: DC | PRN

## 2019-01-19 NOTE — Progress Notes (Signed)
[facility]  Patient ID: Jill Bruce,  MRN: [id number]  PCP: Marco Collie, MD  Date of Evaluation: 01/19/2019 Time spent:15 minutes  Chief Complaint:  Chief Complaint    Medication Refill     Virtual Visit via Telephone Note  I connected with patient by a video enabled telemedicine application or telephone, with their informed consent, and verified patient privacy and that I am speaking with the correct person using two identifiers.  I am private, in my office and the patient is home.   I discussed the limitations, risks, security and privacy concerns of performing an evaluation and management service by telephone and the availability of in person appointments. I also discussed with the patient that there may be a patient responsible charge related to this service. The patient expressed understanding and agreed to proceed.   I discussed the assessment and treatment plan with the patient. The patient was provided an opportunity to ask questions and all were answered. The patient agreed with the plan and demonstrated an understanding of the instructions.   The patient was advised to call back or seek an in-person evaluation if the symptoms worsen or if the condition fails to improve as anticipated.  I provided 15 minutes of non-face-to-face time during this encounter.  HISTORY/CURRENT STATUS: HPI For f/u after starting Trazodone.   The trazodone has really helped her sleep.  She usually only needs 50 mg so sometimes she does have to take 2.  She is able to go to sleep easily and sleep through the night.  She denies any hangover feelings.  Only problem right now is issue w/ knee replacement, has more pain and has seen Ortho. No new meds were given.  Was recommended to have physical therapy but patient states that historically that has caused a worsening of the pain.  She has had 2 prior surgeries on that knee in the past 2 months.  She and her husband are thinking  about whether to get a second opinion or not.  Patient denies loss of interest in usual activities and is able to enjoy things.  Denies decreased energy or motivation.  Appetite has not changed.  No extreme sadness, tearfulness, or feelings of hopelessness.  Denies any changes in concentration, making decisions or remembering things.  Denies suicidal or homicidal thoughts.  She saw her GYN and had hormones tested to see if that could be causing the decreased libido.  States everything was fine there.  Libido is about the same.  Patient denies increased energy with decreased need for sleep, no increased talkativeness, no racing thoughts, no impulsivity or risky behaviors, no increased spending, no increased libido, no grandiosity.  Denies dizziness, syncope, seizures, numbness, tingling, tremor, tics, unsteady gait, slurred speech, confusion. Denies dystonia.  Has chronic muscle and joint pain due to arthritis status post bilateral hip replacement and knee replacements.  Individual Medical History/ Review of Systems: Changes? :Yes  more pain in Right knee, had Bone scan, has loosening in knee replacement.    Past medications for mental health diagnoses include: Ambien, Trintellix, Remeron, Xanax, Prazosin, Seroquel, Prozac, Wellbutrin, Contrave, Lunesta  Allergies: Chantix [varenicline tartrate], Varenicline, Augmentin [amoxicillin-pot clavulanate], Latex, and Tape  Current Medications:  Current Outpatient Medications:  .  albuterol (PROVENTIL HFA;VENTOLIN HFA) 108 (90 BASE) MCG/ACT inhaler, Inhale into the lungs every 6 (six) hours as needed for wheezing or shortness of breath., Disp: , Rfl:  .  ALPRAZolam (XANAX) 1 MG tablet, Take 1 tablet (1 mg total) by  mouth 3 (three) times daily as needed for anxiety or sleep., Disp: 90 tablet, Rfl: 5 .  budesonide-formoterol (SYMBICORT) 80-4.5 MCG/ACT inhaler, Inhale 2 puffs into the lungs 2 (two) times daily., Disp: , Rfl:  .  cetirizine (ZYRTEC) 10 MG  tablet, Take 10 mg by mouth daily., Disp: , Rfl:  .  fexofenadine (ALLEGRA) 180 MG tablet, Take 180 mg by mouth daily., Disp: , Rfl:  .  LINZESS 145 MCG CAPS capsule, , Disp: , Rfl:  .  montelukast (SINGULAIR) 10 MG tablet, Take 10 mg by mouth at bedtime., Disp: , Rfl:  .  naproxen (NAPROSYN) 500 MG tablet, Take 1 tablet (500 mg total) by mouth 2 (two) times daily as needed., Disp: 30 tablet, Rfl: 0 .  pantoprazole (PROTONIX) 40 MG tablet, Take 40 mg by mouth daily., Disp: , Rfl:  .  QUEtiapine (SEROQUEL) 400 MG tablet, Take 1 tablet (400 mg total) by mouth at bedtime., Disp: 90 tablet, Rfl: 1 .  topiramate (TOPAMAX) 100 MG tablet, Take 100 mg by mouth 2 (two) times daily. 1 am, 2 qhs, Disp: , Rfl:  .  traZODone (DESYREL) 50 MG tablet, Take 1-2 tablets (50-100 mg total) by mouth at bedtime as needed for sleep., Disp: 180 tablet, Rfl: 1 .  flunisolide (NASALIDE) 25 MCG/ACT (0.025%) SOLN, Place 2 sprays into the nose 2 (two) times daily. , Disp: , Rfl:  Medication Side Effects: none  Family Medical/ Social History: Changes? No  MENTAL HEALTH EXAM:  There were no vitals taken for this visit.There is no height or weight on file to calculate BMI.  General Appearance: unable to assess  Eye Contact:  unable to assess  Speech:  Clear and Coherent  Volume:  Normal  Mood:  Euthymic  Affect:  unable to assess  Thought Process:  Goal Directed  Orientation:  Full (Time, Place, and Person)  Thought Content: Logical   Suicidal Thoughts:  No  Homicidal Thoughts:  No  Memory:  WNL  Judgement:  Good  Insight:  Good  Psychomotor Activity:  unable to assess  Concentration:  Concentration: Good  Recall:  Good  Fund of Knowledge: Good  Language: Good  Assets:  Desire for Improvement  ADL's:  Intact  Cognition: WNL  Prognosis:  Good    DIAGNOSES:    ICD-10-CM   1. Posttraumatic stress disorder  F43.10   2. Insomnia, unspecified type  G47.00   3. Generalized anxiety disorder  F41.1   4. Sexual  dysfunction  R37   5. Major depressive disorder, recurrent episode, moderate (HCC)  F33.1     Receiving Psychotherapy: No    RECOMMENDATIONS:  From a psych standpoint, she is stable. Continue Xanax 1 mg 3 times daily as needed. Continue Seroquel 400 mg nightly. Continue trazodone 50 mg, 1-2 p.o. nightly as needed sleep. Continue Topamax per neurology for migraines. Return in 6 months.  Melony Overlyeresa Domanic Matusek, PA-C   This record has been created using [employer] software.  Chart creation errors have been sought, but may not always have been located and corrected. Such creation errors do not reflect on the standard of medical care.

## 2019-02-17 ENCOUNTER — Other Ambulatory Visit: Payer: Self-pay

## 2019-02-17 ENCOUNTER — Encounter (HOSPITAL_COMMUNITY): Payer: Self-pay | Admitting: Emergency Medicine

## 2019-02-17 ENCOUNTER — Emergency Department (HOSPITAL_COMMUNITY)
Admission: EM | Admit: 2019-02-17 | Discharge: 2019-02-18 | Disposition: A | Discharge status: Home / Self Care | Payer: Medicare PPO | Attending: Emergency Medicine | Admitting: Emergency Medicine

## 2019-02-17 DIAGNOSIS — Z96651 Presence of right artificial knee joint: Secondary | ICD-10-CM | POA: Diagnosis not present

## 2019-02-17 DIAGNOSIS — Z91040 Latex allergy status: Secondary | ICD-10-CM | POA: Diagnosis not present

## 2019-02-17 DIAGNOSIS — Z96641 Presence of right artificial hip joint: Secondary | ICD-10-CM | POA: Diagnosis not present

## 2019-02-17 DIAGNOSIS — R51 Headache: Secondary | ICD-10-CM | POA: Diagnosis present

## 2019-02-17 DIAGNOSIS — F17210 Nicotine dependence, cigarettes, uncomplicated: Secondary | ICD-10-CM | POA: Insufficient documentation

## 2019-02-17 DIAGNOSIS — Z79899 Other long term (current) drug therapy: Secondary | ICD-10-CM | POA: Diagnosis not present

## 2019-02-17 DIAGNOSIS — J45909 Unspecified asthma, uncomplicated: Secondary | ICD-10-CM | POA: Insufficient documentation

## 2019-02-17 DIAGNOSIS — G43801 Other migraine, not intractable, with status migrainosus: Secondary | ICD-10-CM | POA: Diagnosis not present

## 2019-02-17 LAB — BASIC METABOLIC PANEL
Anion gap: 7 (ref 5–15)
BUN: 14 mg/dL (ref 6–20)
CO2: 20 mmol/L — ABNORMAL LOW (ref 22–32)
Calcium: 8.5 mg/dL — ABNORMAL LOW (ref 8.9–10.3)
Chloride: 110 mmol/L (ref 98–111)
Creatinine, Ser: 0.9 mg/dL (ref 0.44–1.00)
GFR calc Af Amer: >60 mL/min (ref >60)
GFR calc non Af Amer: >60 mL/min (ref >60)
Glucose, Bld: 80 mg/dL (ref 70–99)
Potassium: 3.6 mmol/L (ref 3.5–5.1)
Sodium: 137 mmol/L (ref 135–145)

## 2019-02-17 LAB — CBC
HCT: 40.8 % (ref 36.0–46.0)
Hemoglobin: 13 g/dL (ref 12.0–15.0)
MCH: 30 pg (ref 26.0–34.0)
MCHC: 31.9 g/dL (ref 30.0–36.0)
MCV: 94 fL (ref 80.0–100.0)
Platelets: 250 10*3/uL (ref 150–400)
RBC: 4.34 MIL/uL (ref 3.87–5.11)
RDW: 13.9 % (ref 11.5–15.5)
WBC: 7.8 10*3/uL (ref 4.0–10.5)
nRBC: 0 % (ref 0.0–0.2)

## 2019-02-17 NOTE — ED Notes (Signed)
Called for pt x3 for vitals. No answer.  

## 2019-02-17 NOTE — ED Triage Notes (Addendum)
Pt here for eval of 2 months of migraine and left eye pain. Pt saw her neurologist 1 week ago, increased her Topomax and he also put her on a new medication. Pt is having no relief, pain to left eye continues. Pt states she saw eye MD 2 weeks ago and has an MRI scheduled for the 18th for eval of optic neuritis.

## 2019-02-18 DIAGNOSIS — G43801 Other migraine, not intractable, with status migrainosus: Secondary | ICD-10-CM | POA: Diagnosis not present

## 2019-02-18 MED ORDER — KETOROLAC TROMETHAMINE 30 MG/ML IJ SOLN
30.0000 mg | Freq: Once | INTRAMUSCULAR | Status: AC
  Administered 2019-02-18: 01:00:00 30 mg via INTRAVENOUS
  Filled 2019-02-18: qty 1

## 2019-02-18 MED ORDER — DIPHENHYDRAMINE HCL 50 MG/ML IJ SOLN
25.0000 mg | Freq: Once | INTRAMUSCULAR | Status: AC
  Administered 2019-02-18: 25 mg via INTRAVENOUS
  Filled 2019-02-18: qty 1

## 2019-02-18 MED ORDER — VALPROATE SODIUM 500 MG/5ML IV SOLN
500.0000 mg | Freq: Once | INTRAVENOUS | Status: AC
  Administered 2019-02-18: 500 mg via INTRAVENOUS
  Filled 2019-02-18: qty 5

## 2019-02-18 MED ORDER — PROCHLORPERAZINE EDISYLATE 10 MG/2ML IJ SOLN
10.0000 mg | Freq: Once | INTRAMUSCULAR | Status: AC
  Administered 2019-02-18: 10 mg via INTRAVENOUS
  Filled 2019-02-18: qty 2

## 2019-02-18 NOTE — ED Provider Notes (Signed)
[facility] EMERGENCY DEPARTMENT Provider Note   CSN: [id number] Arrival date & time: 02/17/19  1759    History   Chief Complaint Chief Complaint  Patient presents with  . Migraine  . Eye Pain    HPI Jill Bruce is a 38 y.o. female.     Patient presents to the emergency department for evaluation of migraine headache.  Patient reports that she is a frequent migraine suffer but approximately 2 months ago she started having left-sided migraine headache that has been persistent.  Her neurologist at the [place] has made multiple medication adjustments over the last several weeks but she has not had improvement.  She reports that she has been seen in the ER with migraines in the past and received "migraine cocktail" with good results.  She tells me that her neurologist did not know what this cocktail was and could not give it to her.  She is here tonight because she would like at least some brief relief from her headache.     Past Medical History:  Diagnosis Date  . Allergy   . Anxiety   . Arthritis   . Asthma   . Avascular necrosis ([place])   . Depression   . Fibromyalgia   . GERD (gastroesophageal reflux disease)   . Migraine   . Post traumatic stress disorder (PTSD)     Patient Active Problem List   Diagnosis Date Noted  . Posttraumatic stress disorder 05/14/2018  . Generalized anxiety disorder 05/14/2018  . Migraine 07/18/2015  . Obesity, Class III, BMI 40-49.9 (morbid obesity) ([place]) 12/07/2013    Past Surgical History:  Procedure Laterality Date  . ABDOMINAL HYSTERECTOMY     2012  . BLADDER SUSPENSION     2012  . CHOLECYSTECTOMY     2009  . FOOT SURGERY     2011  . JOINT REPLACEMENT     Right Hip and Right knee - both 2015  . TONSILLECTOMY    . TUBAL LIGATION     2012     OB History    Gravida  3   Para  3   Term  3   Preterm      AB      Living  3     SAB      TAB      Ectopic      Multiple      Live Births              Home Medications    Prior to Admission medications   Medication Sig Start Date End Date Taking? Authorizing Provider  albuterol (PROVENTIL HFA;VENTOLIN HFA) 108 (90 BASE) MCG/ACT inhaler Inhale into the lungs every 6 (six) hours as needed for wheezing or shortness of breath.   Yes [provider]  ALPRAZolam Duanne Moron) 1 MG tablet Take 1 tablet (1 mg total) by mouth 3 (three) times daily as needed for anxiety or sleep. 12/01/18  Yes Hurst, Teresa T, PA-C  budesonide-formoterol (SYMBICORT) 80-4.5 MCG/ACT inhaler Inhale 2 puffs into the lungs 2 (two) times daily.   Yes [provider]  cetirizine (ZYRTEC) 10 MG tablet Take 10 mg by mouth daily.   Yes [provider]  fexofenadine (ALLEGRA) 180 MG tablet Take 180 mg by mouth daily.   Yes [provider]  LINZESS 145 MCG CAPS capsule Take 145 mcg by mouth every 3 (three) days.  11/22/18  Yes [provider]  montelukast (SINGULAIR) 10 MG tablet Take  10 mg by mouth at bedtime.   Yes [provider]  naproxen (NAPROSYN) 500 MG tablet Take 1 tablet (500 mg total) by mouth 2 (two) times daily as needed. Patient taking differently: Take 500 mg by mouth 2 (two) times daily as needed for mild pain.  11/26/16  Yes Judeth HornLawrence, Erin, NP  pantoprazole (PROTONIX) 40 MG tablet Take 40 mg by mouth daily.   Yes [provider]  QUEtiapine (SEROQUEL) 400 MG tablet Take 1 tablet (400 mg total) by mouth at bedtime. 12/01/18  Yes Melony OverlyHurst, Teresa T, PA-C  rizatriptan (MAXALT) 10 MG tablet Take 10 mg by mouth as needed for migraine. May repeat in 2 hours if needed   Yes [provider]  topiramate (TOPAMAX) 100 MG tablet Take 100-200 mg by mouth See admin instructions. Take 1 tablet every morning and at lunch then take 2 tablets at bedtime   Yes [provider]  traZODone (DESYREL) 50 MG tablet Take 1-2 tablets (50-100 mg total) by mouth at bedtime as needed for sleep. 01/19/19  Yes Cherie OuchHurst,  Teresa T, PA-C    Family History Family History  Problem Relation Age of Onset  . COPD Father   . Heart disease Father   . Stroke Father   . Heart attack Father   . Sleep apnea Father   . Kidney failure Father   . Diabetes Other        Paternal aunt and uncle  . Alzheimer's disease Paternal Grandmother     Social History Social History   Tobacco Use  . Smoking status: Current Every Day Smoker    Packs/day: 1.00    Types: Cigarettes, E-cigarettes    Last attempt to quit: 05/09/2008    Years since quitting: 10.7  . Smokeless tobacco: Never Used  Substance Use Topics  . Alcohol use: Yes    Alcohol/week: 0.0 standard drinks    Comment: once q 2-3 months  . Drug use: No     Allergies   Chantix [varenicline tartrate], Varenicline, Augmentin [amoxicillin-pot clavulanate], Latex, and Tape   Review of Systems Review of Systems  Neurological: Positive for headaches.  All other systems reviewed and are negative.    Physical Exam Updated Vital Signs BP 92/68   Pulse (!) 58   Temp 98.5 F (36.9 C) (Oral)   Resp 16   SpO2 98%   Physical Exam Vitals signs and nursing note reviewed.  Constitutional:      General: She is not in acute distress.    Appearance: Normal appearance. She is well-developed.  HENT:     Head: Normocephalic and atraumatic.     Right Ear: Hearing normal.     Left Ear: Hearing normal.     Nose: Nose normal.  Eyes:     Conjunctiva/sclera: Conjunctivae normal.     Pupils: Pupils are equal, round, and reactive to light.  Neck:     Musculoskeletal: Normal range of motion and neck supple.  Cardiovascular:     Rate and Rhythm: Regular rhythm.     Heart sounds: S1 normal and S2 normal. No murmur. No friction rub. No gallop.   Pulmonary:     Effort: Pulmonary effort is normal. No respiratory distress.     Breath sounds: Normal breath sounds.  Chest:     Chest wall: No tenderness.  Abdominal:     General: Bowel sounds are normal.      Palpations: Abdomen is soft.     Tenderness: There is no abdominal tenderness.  There is no guarding or rebound. Negative signs include Murphy's sign and McBurney's sign.     Hernia: No hernia is present.  Musculoskeletal: Normal range of motion.  Skin:    General: Skin is warm and dry.     Findings: No rash.  Neurological:     Mental Status: She is alert and oriented to person, place, and time.     GCS: GCS eye subscore is 4. GCS verbal subscore is 5. GCS motor subscore is 6.     Cranial Nerves: No cranial nerve deficit.     Sensory: No sensory deficit.     Coordination: Coordination normal.  Psychiatric:        Speech: Speech normal.        Behavior: Behavior normal.        Thought Content: Thought content normal.      ED Treatments / Results  Labs (all labs ordered are listed, but only abnormal results are displayed) Labs Reviewed  BASIC METABOLIC PANEL - Abnormal; Notable for the following components:      Result Value   CO2 20 (*)    Calcium 8.5 (*)    All other components within normal limits  CBC    EKG None  Radiology No results found.  Procedures Procedures (including critical care time)  Medications Ordered in ED Medications  ketorolac (TORADOL) 30 MG/ML injection 30 mg (30 mg Intravenous Given 02/18/19 0111)  prochlorperazine (COMPAZINE) injection 10 mg (10 mg Intravenous Given 02/18/19 0111)  diphenhydrAMINE (BENADRYL) injection 25 mg (25 mg Intravenous Given 02/18/19 0110)  valproate (DEPACON) 500 mg in dextrose 5 % 50 mL IVPB (0 mg Intravenous Stopped 02/18/19 0256)     Initial Impression / Assessment and Plan / ED Course  I have reviewed the triage vital signs and the nursing notes.  Pertinent labs & imaging results that were available during my care of the patient were reviewed by me and considered in my medical decision making (see chart for details).        Patient presented to the emergency department for evaluation of headache.  Patient has a  history of migraines.  She appears to be an status migrainosus today, reporting headache that has been ongoing for 2 months.  She has had multiple medication changes made by her neurologist but continues to have the headache.  She reports that she has had good success with IV medications given to her in the ER for migraine previously.  She was treated with a combination of Toradol, Compazine, Benadryl, Depakote all IV.  She has had significant improvement in her headache.  There are no focal neurologic findings on examination.  Patient does report that she has been identified as having possible optic neuritis or at least "enlarged optic nerves" by her eye doctor.  She is undergoing work-up for this currently.  She has an MRI scheduled.  She has not noticed any visual change and therefore does not have any urgency for work-up tonight.  Final Clinical Impressions(s) / ED Diagnoses   Final diagnoses:  Other migraine with status migrainosus, not intractable    ED Discharge Orders    None       Garl Speigner, Canary Brimhristopher J, MD 02/18/19 [phone]

## 2019-02-18 NOTE — Discharge Instructions (Addendum)
You were treated with Toradol 30mg  IV, Compazine 10mg  IV, Benadryl 25mg  IV and Depakote 500mg  IV in the Emergency Department Today.

## 2019-05-18 DIAGNOSIS — M25562 Pain in left knee: Secondary | ICD-10-CM | POA: Diagnosis not present

## 2019-05-26 DIAGNOSIS — M25562 Pain in left knee: Secondary | ICD-10-CM | POA: Diagnosis not present

## 2019-06-02 ENCOUNTER — Other Ambulatory Visit: Payer: Self-pay | Admitting: Physician Assistant

## 2019-06-15 DIAGNOSIS — Z96651 Presence of right artificial knee joint: Secondary | ICD-10-CM | POA: Diagnosis not present

## 2019-06-15 DIAGNOSIS — Z1159 Encounter for screening for other viral diseases: Secondary | ICD-10-CM | POA: Diagnosis not present

## 2019-06-15 DIAGNOSIS — T8484XA Pain due to internal orthopedic prosthetic devices, implants and grafts, initial encounter: Secondary | ICD-10-CM | POA: Diagnosis not present

## 2019-06-15 DIAGNOSIS — M25262 Flail joint, left knee: Secondary | ICD-10-CM | POA: Diagnosis not present

## 2019-06-15 DIAGNOSIS — F17210 Nicotine dependence, cigarettes, uncomplicated: Secondary | ICD-10-CM | POA: Diagnosis not present

## 2019-06-15 DIAGNOSIS — M222X2 Patellofemoral disorders, left knee: Secondary | ICD-10-CM | POA: Diagnosis not present

## 2019-06-20 DIAGNOSIS — T8484XA Pain due to internal orthopedic prosthetic devices, implants and grafts, initial encounter: Secondary | ICD-10-CM | POA: Diagnosis not present

## 2019-06-20 DIAGNOSIS — T84093A Other mechanical complication of internal left knee prosthesis, initial encounter: Secondary | ICD-10-CM | POA: Diagnosis not present

## 2019-06-20 DIAGNOSIS — M25262 Flail joint, left knee: Secondary | ICD-10-CM | POA: Diagnosis not present

## 2019-06-20 DIAGNOSIS — Z96651 Presence of right artificial knee joint: Secondary | ICD-10-CM | POA: Diagnosis not present

## 2019-06-20 DIAGNOSIS — Z96652 Presence of left artificial knee joint: Secondary | ICD-10-CM | POA: Diagnosis not present

## 2019-06-20 DIAGNOSIS — G8918 Other acute postprocedural pain: Secondary | ICD-10-CM | POA: Diagnosis not present

## 2019-06-20 DIAGNOSIS — F17210 Nicotine dependence, cigarettes, uncomplicated: Secondary | ICD-10-CM | POA: Diagnosis not present

## 2019-06-20 DIAGNOSIS — M222X2 Patellofemoral disorders, left knee: Secondary | ICD-10-CM | POA: Diagnosis not present

## 2019-06-27 ENCOUNTER — Other Ambulatory Visit: Payer: Self-pay | Admitting: Physician Assistant

## 2019-06-30 DIAGNOSIS — M79672 Pain in left foot: Secondary | ICD-10-CM | POA: Diagnosis not present

## 2019-07-04 ENCOUNTER — Other Ambulatory Visit: Payer: Self-pay | Admitting: Physician Assistant

## 2019-07-06 DIAGNOSIS — M25562 Pain in left knee: Secondary | ICD-10-CM | POA: Diagnosis not present

## 2019-07-12 DIAGNOSIS — M1712 Unilateral primary osteoarthritis, left knee: Secondary | ICD-10-CM | POA: Diagnosis not present

## 2019-07-12 DIAGNOSIS — M25562 Pain in left knee: Secondary | ICD-10-CM | POA: Diagnosis not present

## 2019-07-12 DIAGNOSIS — Z96652 Presence of left artificial knee joint: Secondary | ICD-10-CM | POA: Diagnosis not present

## 2019-07-18 DIAGNOSIS — Z96652 Presence of left artificial knee joint: Secondary | ICD-10-CM | POA: Diagnosis not present

## 2019-07-18 DIAGNOSIS — M25562 Pain in left knee: Secondary | ICD-10-CM | POA: Diagnosis not present

## 2019-07-18 DIAGNOSIS — M1712 Unilateral primary osteoarthritis, left knee: Secondary | ICD-10-CM | POA: Diagnosis not present

## 2019-07-20 DIAGNOSIS — Z96652 Presence of left artificial knee joint: Secondary | ICD-10-CM | POA: Diagnosis not present

## 2019-07-20 DIAGNOSIS — M25562 Pain in left knee: Secondary | ICD-10-CM | POA: Diagnosis not present

## 2019-07-20 DIAGNOSIS — M1712 Unilateral primary osteoarthritis, left knee: Secondary | ICD-10-CM | POA: Diagnosis not present

## 2019-07-25 DIAGNOSIS — M25562 Pain in left knee: Secondary | ICD-10-CM | POA: Diagnosis not present

## 2019-07-25 DIAGNOSIS — M1712 Unilateral primary osteoarthritis, left knee: Secondary | ICD-10-CM | POA: Diagnosis not present

## 2019-07-25 DIAGNOSIS — Z96652 Presence of left artificial knee joint: Secondary | ICD-10-CM | POA: Diagnosis not present

## 2019-08-01 DIAGNOSIS — M1712 Unilateral primary osteoarthritis, left knee: Secondary | ICD-10-CM | POA: Diagnosis not present

## 2019-08-01 DIAGNOSIS — Z96652 Presence of left artificial knee joint: Secondary | ICD-10-CM | POA: Diagnosis not present

## 2019-08-01 DIAGNOSIS — M25562 Pain in left knee: Secondary | ICD-10-CM | POA: Diagnosis not present

## 2019-08-03 DIAGNOSIS — M1712 Unilateral primary osteoarthritis, left knee: Secondary | ICD-10-CM | POA: Diagnosis not present

## 2019-08-03 DIAGNOSIS — Z96652 Presence of left artificial knee joint: Secondary | ICD-10-CM | POA: Diagnosis not present

## 2019-08-03 DIAGNOSIS — M25562 Pain in left knee: Secondary | ICD-10-CM | POA: Diagnosis not present

## 2019-08-10 DIAGNOSIS — Z96652 Presence of left artificial knee joint: Secondary | ICD-10-CM | POA: Diagnosis not present

## 2019-08-10 DIAGNOSIS — M1712 Unilateral primary osteoarthritis, left knee: Secondary | ICD-10-CM | POA: Diagnosis not present

## 2019-08-10 DIAGNOSIS — M25562 Pain in left knee: Secondary | ICD-10-CM | POA: Diagnosis not present

## 2019-08-28 DIAGNOSIS — M1712 Unilateral primary osteoarthritis, left knee: Secondary | ICD-10-CM | POA: Diagnosis not present

## 2019-08-28 DIAGNOSIS — M25562 Pain in left knee: Secondary | ICD-10-CM | POA: Diagnosis not present

## 2019-08-28 DIAGNOSIS — Z96652 Presence of left artificial knee joint: Secondary | ICD-10-CM | POA: Diagnosis not present

## 2019-09-04 ENCOUNTER — Other Ambulatory Visit: Payer: Self-pay | Admitting: Physician Assistant

## 2019-09-05 NOTE — Telephone Encounter (Signed)
Next apt 03/09

## 2019-09-19 ENCOUNTER — Encounter: Payer: Self-pay | Admitting: Physician Assistant

## 2019-09-19 ENCOUNTER — Ambulatory Visit (INDEPENDENT_AMBULATORY_CARE_PROVIDER_SITE_OTHER): Payer: Medicare PPO | Admitting: Physician Assistant

## 2019-09-19 DIAGNOSIS — F331 Major depressive disorder, recurrent, moderate: Secondary | ICD-10-CM

## 2019-09-19 DIAGNOSIS — F411 Generalized anxiety disorder: Secondary | ICD-10-CM | POA: Diagnosis not present

## 2019-09-19 DIAGNOSIS — M25562 Pain in left knee: Secondary | ICD-10-CM | POA: Diagnosis not present

## 2019-09-19 DIAGNOSIS — G4700 Insomnia, unspecified: Secondary | ICD-10-CM

## 2019-09-19 DIAGNOSIS — F4310 Post-traumatic stress disorder, unspecified: Secondary | ICD-10-CM

## 2019-09-19 MED ORDER — TRAZODONE HCL 100 MG PO TABS
100.0000 mg | ORAL_TABLET | Freq: Every evening | ORAL | 1 refills | Status: DC | PRN
Start: 2019-09-19 — End: 2019-10-25

## 2019-09-19 MED ORDER — QUETIAPINE FUMARATE 300 MG PO TABS: 600.0000 mg | ORAL_TABLET | Freq: Every day | ORAL | 1 refills | Status: DC

## 2019-09-19 MED ORDER — ALPRAZOLAM 1 MG PO TABS: ORAL_TABLET | ORAL | 5 refills | Status: DC

## 2019-09-19 NOTE — Progress Notes (Signed)
[facility] Med [facility]  Patient ID: Jill Bruce,  MRN: [id number]  PCP: [facility]  Date of Evaluation: 09/19/2019 Time spent:20 minutes  Chief Complaint:  Chief Complaint    Medication Refill     Virtual Visit via Telephone Note  I connected with patient by a video enabled telemedicine application or telephone, with their informed consent, and verified patient privacy and that I am speaking with the correct person using two identifiers.  I am private, in my office and the patient is at home.  I discussed the limitations, risks, security and privacy concerns of performing an evaluation and management service by telephone and the availability of in person appointments. I also discussed with the patient that there may be a patient responsible charge related to this service. The patient expressed understanding and agreed to proceed.   I discussed the assessment and treatment plan with the patient. The patient was provided an opportunity to ask questions and all were answered. The patient agreed with the plan and demonstrated an understanding of the instructions.   The patient was advised to call back or seek an in-person evaluation if the symptoms worsen or if the condition fails to improve as anticipated.  I provided 20 minutes of non-face-to-face time during this encounter.  HISTORY/CURRENT STATUS: HPI For routine med check.  Has had a lot health problems since she was here the last time.  See ROS.    Feels that the psychiatric meds are working well, in spite of these health problems.  She has gotten a little bit depressed though and wonders if we can tweak her medicines a little bit.  She just feels down in the dumps sometimes.  But mostly she is doing well.  She is able to enjoy things, denies problems with energy or motivation.  Her kids are being homeschooled and that can be challenging at times but for the most part she is okay.  Appetite and weight are stable.   Not sleeping as well as she would like.  Not having nightmares.  She is having to take the trazodone 100 mg every night and it is not always effective.  Denies any suicidal or homicidal thoughts.  Patient denies increased energy with decreased need for sleep, no increased talkativeness, no racing thoughts, no impulsivity or risky behaviors, no increased spending, no increased libido, no grandiosity.  Anxiety is mostly controlled with the Xanax.  With everything that is going on, homeschooling her kids, COVID, her own health problems, it makes her more anxious.  Denies dizziness, syncope, seizures, numbness, tingling, tremor, tics, unsteady gait, slurred speech, confusion. Denies muscle or joint pain, stiffness, or dystonia.  Individual Medical History/ Review of Systems: Changes? :Yes   Had a breast nodule, seems to be benign and will have to have twice a year mammograms for several years. Had knee revision done in December. Also she was dx w/ bilat closed pressure glaucoma.  Past medications for mental health diagnoses include: Ambien, Trintellix, Remeron, Xanax, Prazosin, Seroquel, Prozac, Wellbutrin, Contrave, Lunesta  Allergies: Chantix [varenicline tartrate], Varenicline, Augmentin [amoxicillin-pot clavulanate], Latex, and Tape  Current Medications:  Current Outpatient Medications:  .  albuterol (PROVENTIL HFA;VENTOLIN HFA) 108 (90 BASE) MCG/ACT inhaler, Inhale into the lungs every 6 (six) hours as needed for wheezing or shortness of breath., Disp: , Rfl:  .  ALPRAZolam (XANAX) 1 MG tablet, TAKE 1 TABLET BY MOUTH THREE TIMES DAILY AS NEEDED FOR ANXIETY AND FOR SLEEP, Disp: 90 tablet, Rfl: 5 .  cetirizine (  ZYRTEC) 10 MG tablet, Take 10 mg by mouth daily., Disp: , Rfl:  .  fexofenadine (ALLEGRA) 180 MG tablet, Take 180 mg by mouth daily., Disp: , Rfl:  .  montelukast (SINGULAIR) 10 MG tablet, Take 10 mg by mouth at bedtime., Disp: , Rfl:  .  naproxen (NAPROSYN) 500 MG tablet, Take 1 tablet  (500 mg total) by mouth 2 (two) times daily as needed. (Patient taking differently: Take 500 mg by mouth 2 (two) times daily as needed for mild pain. ), Disp: 30 tablet, Rfl: 0 .  pantoprazole (PROTONIX) 40 MG tablet, Take 40 mg by mouth daily., Disp: , Rfl:  .  rizatriptan (MAXALT) 10 MG tablet, Take 10 mg by mouth as needed for migraine. May repeat in 2 hours if needed, Disp: , Rfl:  .  topiramate (TOPAMAX) 100 MG tablet, Take 100-200 mg by mouth See admin instructions. Take 1 tablet every morning and at lunch then take 2 tablets at bedtime, Disp: , Rfl:  .  budesonide-formoterol (SYMBICORT) 80-4.5 MCG/ACT inhaler, Inhale 2 puffs into the lungs 2 (two) times daily., Disp: , Rfl:  .  LINZESS 145 MCG CAPS capsule, Take 145 mcg by mouth every 3 (three) days. , Disp: , Rfl:  .  QUEtiapine (SEROQUEL) 300 MG tablet, Take 2 tablets (600 mg total) by mouth at bedtime., Disp: 60 tablet, Rfl: 1 .  traZODone (DESYREL) 100 MG tablet, Take 1 tablet (100 mg total) by mouth at bedtime as needed for sleep., Disp: 90 tablet, Rfl: 1 Medication Side Effects: none  Family Medical/ Social History: Changes? No  MENTAL HEALTH EXAM:  There were no vitals taken for this visit.There is no height or weight on file to calculate BMI.  General Appearance: Unable to assess  Eye Contact:  Unable to assess  Speech:  Clear and Coherent and Normal Rate  Volume:  Normal  Mood:  Unable to assess  Affect:  Congruent  Thought Process:  Goal Directed and Descriptions of Associations: Intact  Orientation:  Full (Time, Place, and Person)  Thought Content: Logical   Suicidal Thoughts:  Yes.  without intent/plan  Homicidal Thoughts:  No  Memory:  Immediate;   Good  Judgement:  Fair  Insight:  Good  Psychomotor Activity:  Unable to assess  Concentration:  Concentration: Good  Recall:  Good  Fund of Knowledge: Good  Language: Good  Assets:  Desire for Improvement  ADL's:  Intact  Cognition: WNL  Prognosis:  Good     DIAGNOSES:    ICD-10-CM   1. Posttraumatic stress disorder  F43.10   2. Generalized anxiety disorder  F41.1   3. Major depressive disorder, recurrent episode, moderate (HCC)  F33.1   4. Insomnia, unspecified type  G47.00     Receiving Psychotherapy: No    RECOMMENDATIONS:  PDMP was reviewed. Spent 20 minutes with her. We discussed different options and given the PTSD history along with the mild worsening of depression, and the fact that she cannot sleep as well, I think we should increase the Seroquel. Increase Seroquel to 300 mg, 2 p.o. nightly. Continue Xanax 1 mg p.o. 3 times daily as needed sleep or anxiety. Continue trazodone 100 mg, 1 p.o. nightly as needed sleep. Return in 4 to 6 weeks.  Melony Overly, PA-C

## 2019-09-21 ENCOUNTER — Encounter: Payer: Self-pay | Admitting: Physician Assistant

## 2019-10-05 ENCOUNTER — Emergency Department (HOSPITAL_COMMUNITY): Payer: Medicare PPO

## 2019-10-05 ENCOUNTER — Emergency Department (HOSPITAL_COMMUNITY)
Admission: EM | Admit: 2019-10-05 | Discharge: 2019-10-06 | Disposition: A | Discharge status: Home / Self Care | Payer: Medicare PPO | Attending: Emergency Medicine | Admitting: Emergency Medicine

## 2019-10-05 ENCOUNTER — Encounter (HOSPITAL_COMMUNITY): Payer: Self-pay

## 2019-10-05 ENCOUNTER — Other Ambulatory Visit: Payer: Self-pay

## 2019-10-05 DIAGNOSIS — R410 Disorientation, unspecified: Secondary | ICD-10-CM | POA: Diagnosis not present

## 2019-10-05 DIAGNOSIS — Z5321 Procedure and treatment not carried out due to patient leaving prior to being seen by health care provider: Secondary | ICD-10-CM | POA: Diagnosis not present

## 2019-10-05 LAB — COMPREHENSIVE METABOLIC PANEL
ALT: 15 U/L (ref 0–44)
AST: 17 U/L (ref 15–41)
Albumin: 3.9 g/dL (ref 3.5–5.0)
Alkaline Phosphatase: 58 U/L (ref 38–126)
Anion gap: 10 (ref 5–15)
BUN: 12 mg/dL (ref 6–20)
CO2: 19 mmol/L — ABNORMAL LOW (ref 22–32)
Calcium: 8.9 mg/dL (ref 8.9–10.3)
Chloride: 111 mmol/L (ref 98–111)
Creatinine, Ser: 0.81 mg/dL (ref 0.44–1.00)
GFR calc Af Amer: >60 mL/min (ref >60)
GFR calc non Af Amer: >60 mL/min (ref >60)
Glucose, Bld: 106 mg/dL — ABNORMAL HIGH (ref 70–99)
Potassium: 3.9 mmol/L (ref 3.5–5.1)
Sodium: 140 mmol/L (ref 135–145)
Total Bilirubin: 0.2 mg/dL — ABNORMAL LOW (ref 0.3–1.2)
Total Protein: 6.9 g/dL (ref 6.5–8.1)

## 2019-10-05 LAB — I-STAT CHEM 8, ED
BUN: 14 mg/dL (ref 6–20)
Calcium, Ion: 1.16 mmol/L (ref 1.15–1.40)
Chloride: 109 mmol/L (ref 98–111)
Creatinine, Ser: 0.8 mg/dL (ref 0.44–1.00)
Glucose, Bld: 102 mg/dL — ABNORMAL HIGH (ref 70–99)
HCT: 41 % (ref 36.0–46.0)
Hemoglobin: 13.9 g/dL (ref 12.0–15.0)
Potassium: 3.7 mmol/L (ref 3.5–5.1)
Sodium: 140 mmol/L (ref 135–145)
TCO2: 19 mmol/L — ABNORMAL LOW (ref 22–32)

## 2019-10-05 LAB — DIFFERENTIAL
Abs Immature Granulocytes: 0.03 10*3/uL (ref 0.00–0.07)
Basophils Absolute: 0.1 10*3/uL (ref 0.0–0.1)
Basophils Relative: 1 %
Eosinophils Absolute: 0.5 10*3/uL (ref 0.0–0.5)
Eosinophils Relative: 5 %
Immature Granulocytes: 0 %
Lymphocytes Relative: 34 %
Lymphs Abs: 3.6 10*3/uL (ref 0.7–4.0)
Monocytes Absolute: 0.7 10*3/uL (ref 0.1–1.0)
Monocytes Relative: 7 %
Neutro Abs: 5.4 10*3/uL (ref 1.7–7.7)
Neutrophils Relative %: 53 %

## 2019-10-05 LAB — I-STAT BETA HCG BLOOD, ED (MC, WL, AP ONLY): I-stat hCG, quantitative: <5 m[IU]/mL (ref <5)

## 2019-10-05 LAB — PROTIME-INR
INR: 1 (ref 0.8–1.2)
Prothrombin Time: 13.3 seconds (ref 11.4–15.2)

## 2019-10-05 LAB — CBC
HCT: 42.1 % (ref 36.0–46.0)
Hemoglobin: 13.2 g/dL (ref 12.0–15.0)
MCH: 29.4 pg (ref 26.0–34.0)
MCHC: 31.4 g/dL (ref 30.0–36.0)
MCV: 93.8 fL (ref 80.0–100.0)
Platelets: 327 10*3/uL (ref 150–400)
RBC: 4.49 MIL/uL (ref 3.87–5.11)
RDW: 13.7 % (ref 11.5–15.5)
WBC: 10.4 10*3/uL (ref 4.0–10.5)
nRBC: 0 % (ref 0.0–0.2)

## 2019-10-05 LAB — CBG MONITORING, ED: Glucose-Capillary: 98 mg/dL (ref 70–99)

## 2019-10-05 LAB — APTT: aPTT: 28 seconds (ref 24–36)

## 2019-10-05 MED ORDER — SODIUM CHLORIDE 0.9% FLUSH: 3.0000 mL | Freq: Once | INTRAVENOUS | Status: DC

## 2019-10-05 NOTE — ED Notes (Signed)
Pt did not respond when called rfor vitals check

## 2019-10-05 NOTE — ED Triage Notes (Signed)
Pt arrives to ED w/ c/o sudden onset of confusion, blurred vision, and aphasia that lasted approximately 30 mins. This occurred at 1630. Symptoms have completely resolved. NIHSS 0 in triage. Pt AOx4, neuro intact, NAD.

## 2019-10-06 NOTE — ED Notes (Signed)
Pt stated they were leaving  

## 2019-10-19 ENCOUNTER — Ambulatory Visit: Payer: Medicare PPO | Admitting: Physician Assistant

## 2019-10-25 ENCOUNTER — Encounter: Payer: Self-pay | Admitting: Physician Assistant

## 2019-10-25 ENCOUNTER — Ambulatory Visit (INDEPENDENT_AMBULATORY_CARE_PROVIDER_SITE_OTHER): Payer: Medicare PPO | Admitting: Physician Assistant

## 2019-10-25 DIAGNOSIS — F331 Major depressive disorder, recurrent, moderate: Secondary | ICD-10-CM

## 2019-10-25 DIAGNOSIS — F4310 Post-traumatic stress disorder, unspecified: Secondary | ICD-10-CM

## 2019-10-25 DIAGNOSIS — G43909 Migraine, unspecified, not intractable, without status migrainosus: Secondary | ICD-10-CM

## 2019-10-25 DIAGNOSIS — F411 Generalized anxiety disorder: Secondary | ICD-10-CM | POA: Diagnosis not present

## 2019-10-25 DIAGNOSIS — G4700 Insomnia, unspecified: Secondary | ICD-10-CM

## 2019-10-25 MED ORDER — TRAZODONE HCL 100 MG PO TABS: 100.0000 mg | ORAL_TABLET | Freq: Every evening | ORAL | 1 refills | Status: DC | PRN

## 2019-10-25 MED ORDER — QUETIAPINE FUMARATE 300 MG PO TABS: 600.0000 mg | ORAL_TABLET | Freq: Every day | ORAL | 1 refills | Status: DC

## 2019-10-25 NOTE — Progress Notes (Signed)
[facility]  Patient ID: Jill Bruce,  MRN: [id number]  PCP: [facility]  Date of Evaluation: 10/25/2019 Time spent:20 minutes  Chief Complaint:  Chief Complaint    Anxiety; Depression; Insomnia; Follow-up     Virtual Visit via Telephone Note  I connected with patient by a video enabled telemedicine application or telephone, with their informed consent, and verified patient privacy and that I am speaking with the correct person using two identifiers.  I am private, in my office and the patient is home.  I discussed the limitations, risks, security and privacy concerns of performing an evaluation and management service by telephone and the availability of in person appointments. I also discussed with the patient that there may be a patient responsible charge related to this service. The patient expressed understanding and agreed to proceed.   I discussed the assessment and treatment plan with the patient. The patient was provided an opportunity to ask questions and all were answered. The patient agreed with the plan and demonstrated an understanding of the instructions.   The patient was advised to call back or seek an in-person evaluation if the symptoms worsen or if the condition fails to improve as anticipated.  I provided 20 minutes of non-face-to-face time during this encounter.  HISTORY/CURRENT STATUS: HPI For routine med check.  At [place], we increased the Seroquel b/c she had been more depressed.  She feels a lot better now. Not down in the dumps like she reported at that time.   Patient denies loss of interest in usual activities and is able to enjoy things.  Denies decreased energy or motivation.  Appetite has not changed.  No extreme sadness, tearfulness, or feelings of hopelessness.  Denies any changes in concentration, making decisions or remembering things.  Denies suicidal or homicidal thoughts.   Patient denies increased energy with  decreased need for sleep, no increased talkativeness, no racing thoughts, no impulsivity or risky behaviors, no increased spending, no increased libido, no grandiosity, no increased irritability or anger, and no hallucinations.  Anxiety is still an issue. She avoids shopping b/c gets more anxious in stores, especially since the pandemic.  Other people get too close, plus wearing the mask makes her more anxious b/c claustrophobia.   Migraines are worse.  Had to go to ER for one, when she thought she was having a stroke. 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  Topamax is being increased by neuro.   Denies dizziness, syncope, seizures, numbness, tingling, tremor, tics, unsteady gait, slurred speech, confusion. Denies muscle or joint pain, stiffness, or dystonia.  Individual Medical History/ Review of Systems: Changes? :No    Past medications for mental health diagnoses include: Trintellix, Remeron, Xanax, Prazosin, Seroquel, Prozac, Wellbutrin, Contrave, Lunesta  Allergies: Chantix [varenicline tartrate], Varenicline, Augmentin [amoxicillin-pot clavulanate], Latex, and Tape  Current Medications:  Current Outpatient Medications:  .  albuterol (PROVENTIL HFA;VENTOLIN HFA) 108 (90 BASE) MCG/ACT inhaler, Inhale into the lungs every 6 (six) hours as needed for wheezing or shortness of breath., Disp: , Rfl:  .  ALPRAZolam (XANAX) 1 MG tablet, TAKE 1 TABLET BY MOUTH THREE TIMES DAILY AS NEEDED FOR ANXIETY AND FOR SLEEP, Disp: 90 tablet, Rfl: 5 .  budesonide-formoterol (SYMBICORT) 80-4.5 MCG/ACT inhaler, Inhale 2 puffs into the lungs 2 (two) times daily., Disp: , Rfl:  .  cetirizine (ZYRTEC) 10 MG tablet, Take 10 mg by mouth daily., Disp: , Rfl:  .  fexofenadine (ALLEGRA) 180 MG tablet, Take 180 mg by mouth  daily., Disp: , Rfl:  .  montelukast (SINGULAIR) 10 MG tablet, Take 10 mg by mouth at bedtime., Disp: , Rfl:  .  naproxen (NAPROSYN) 500 MG tablet, Take 1 tablet (500 mg total) by mouth 2 (two) times  daily as needed. (Patient taking differently: Take 500 mg by mouth 2 (two) times daily as needed for mild pain. ), Disp: 30 tablet, Rfl: 0 .  pantoprazole (PROTONIX) 40 MG tablet, Take 40 mg by mouth daily., Disp: , Rfl:  .  QUEtiapine (SEROQUEL) 300 MG tablet, Take 2 tablets (600 mg total) by mouth at bedtime., Disp: 180 tablet, Rfl: 1 .  rizatriptan (MAXALT) 10 MG tablet, Take 10 mg by mouth as needed for migraine. May repeat in 2 hours if needed, Disp: , Rfl:  .  topiramate (TOPAMAX) 100 MG tablet, Take 100-200 mg by mouth See admin instructions. Take 1 tablet every morning and at lunch then take 2 tablets at bedtime, Disp: , Rfl:  .  traZODone (DESYREL) 100 MG tablet, Take 1-2 tablets (100-200 mg total) by mouth at bedtime as needed for sleep., Disp: 180 tablet, Rfl: 1 .  verapamil (CALAN-SR) 120 MG CR tablet, Take 120 mg by mouth daily., Disp: , Rfl:  .  LINZESS 145 MCG CAPS capsule, Take 145 mcg by mouth every 3 (three) days. , Disp: , Rfl:  Medication Side Effects: none  Family Medical/ Social History: Changes? No  MENTAL HEALTH EXAM:  There were no vitals taken for this visit.There is no height or weight on file to calculate BMI.  General Appearance: unable to assess  Eye Contact:  Unable to assess  Speech:  Clear and Coherent and Normal Rate  Volume:  Normal  Mood:  Euthymic  Affect:  Unable to assess  Thought Process:  Goal Directed and Descriptions of Associations: Intact  Orientation:  Full (Time, Place, and Person)  Thought Content: Logical   Suicidal Thoughts:  No  Homicidal Thoughts:  No  Memory:  WNL  Judgement:  Good  Insight:  Good  Psychomotor Activity:  Unable to assess  Concentration:  Concentration: Good  Recall:  Good  Fund of Knowledge: Good  Language: Good  Assets:  Desire for Improvement  ADL's:  Intact  Cognition: WNL  Prognosis:  Good  10/05/19 Glucose was 106. Cholesterol was nl at [facility] recently.   DIAGNOSES:    ICD-10-CM   1. Major depressive  disorder, recurrent episode, moderate (HCC)  F33.1   2. Posttraumatic stress disorder  F43.10   3. Generalized anxiety disorder  F41.1   4. Insomnia, unspecified type  G47.00   5. Migraine without status migrainosus, not intractable, unspecified migraine type  G43.909     Receiving Psychotherapy: No    RECOMMENDATIONS:  PDMP reviewed. I spent 20 minutes w/ her. We discussed increasing the Trazodone. If not effective in 2 wks or so, she can call and I'll send in Ambien.  Benefits, risks, side effects were discussed and she accepts.  Increase Trazodone 100 mg to 1-2 po qhs prn.  Continue Xanax 1 mg, 1 po tid prn. Continue Seroquel 300 mg, 2 po qhs. Continue follow-up with neuro for migraines. Return in 3 months.  Melony Overly, PA-C

## 2019-12-02 DIAGNOSIS — R0789 Other chest pain: Secondary | ICD-10-CM | POA: Diagnosis not present

## 2019-12-02 DIAGNOSIS — R072 Precordial pain: Secondary | ICD-10-CM | POA: Diagnosis not present

## 2019-12-02 DIAGNOSIS — K219 Gastro-esophageal reflux disease without esophagitis: Secondary | ICD-10-CM | POA: Diagnosis not present

## 2019-12-02 DIAGNOSIS — R079 Chest pain, unspecified: Secondary | ICD-10-CM | POA: Diagnosis not present

## 2019-12-02 DIAGNOSIS — F419 Anxiety disorder, unspecified: Secondary | ICD-10-CM | POA: Diagnosis not present

## 2020-04-10 ENCOUNTER — Telehealth: Payer: Self-pay | Admitting: Physician Assistant

## 2020-04-10 ENCOUNTER — Other Ambulatory Visit: Payer: Self-pay

## 2020-04-10 MED ORDER — ALPRAZOLAM 1 MG PO TABS: ORAL_TABLET | ORAL | 0 refills | Status: DC

## 2020-04-10 NOTE — Telephone Encounter (Signed)
Patient should have 2 refills left on her Xanax Rx sent in March, but it's probably expired at this point.  Pended for Rosey Bath to send. Last refill 02/13/2020 Has apt 04/12/20

## 2020-04-10 NOTE — Telephone Encounter (Signed)
Pt would like a refill on xanax 1mg . Please send to [facility] in [place]. Pt made appt for 10/1

## 2020-04-12 ENCOUNTER — Ambulatory Visit (INDEPENDENT_AMBULATORY_CARE_PROVIDER_SITE_OTHER): Payer: Medicare PPO | Admitting: Physician Assistant

## 2020-04-12 ENCOUNTER — Encounter: Payer: Self-pay | Admitting: Physician Assistant

## 2020-04-12 ENCOUNTER — Other Ambulatory Visit: Payer: Self-pay

## 2020-04-12 DIAGNOSIS — F3341 Major depressive disorder, recurrent, in partial remission: Secondary | ICD-10-CM | POA: Diagnosis not present

## 2020-04-12 DIAGNOSIS — G4700 Insomnia, unspecified: Secondary | ICD-10-CM

## 2020-04-12 DIAGNOSIS — G43909 Migraine, unspecified, not intractable, without status migrainosus: Secondary | ICD-10-CM | POA: Diagnosis not present

## 2020-04-12 DIAGNOSIS — G40209 Localization-related (focal) (partial) symptomatic epilepsy and epileptic syndromes with complex partial seizures, not intractable, without status epilepticus: Secondary | ICD-10-CM

## 2020-04-12 DIAGNOSIS — R41840 Attention and concentration deficit: Secondary | ICD-10-CM | POA: Diagnosis not present

## 2020-04-12 DIAGNOSIS — F411 Generalized anxiety disorder: Secondary | ICD-10-CM | POA: Diagnosis not present

## 2020-04-12 DIAGNOSIS — F4310 Post-traumatic stress disorder, unspecified: Secondary | ICD-10-CM

## 2020-04-12 MED ORDER — TRAZODONE HCL 100 MG PO TABS: 100.0000 mg | ORAL_TABLET | Freq: Every evening | ORAL | 1 refills | Status: DC | PRN

## 2020-04-12 MED ORDER — ALPRAZOLAM 1 MG PO TABS: ORAL_TABLET | ORAL | 5 refills | Status: DC

## 2020-04-12 MED ORDER — QUETIAPINE FUMARATE 300 MG PO TABS: 600.0000 mg | ORAL_TABLET | Freq: Every day | ORAL | 1 refills | Status: DC

## 2020-04-12 NOTE — Progress Notes (Signed)
[facility]  Patient ID: Jill Bruce,  MRN: [id number]  PCP: [facility]  Date of Evaluation: 04/12/2020 Time spent:40 minutes  Chief Complaint:  Chief Complaint    Anxiety; Depression; Insomnia      HISTORY/CURRENT STATUS: HPI For routine med check.  Recently dx w/ Complex Partial Seizures and started Keppra. She was having time lapses and forgetting things. Hasn't been long enough to know how helpful it is. Migraines had been worse as well as having times where she would 'blank out.'  Complains of being really scattered.  Will start something in one room of her house, get sidetracked to something else, and after awhile, the whole house is a mess and she hasn't accomplished anything. Asks if we test for ADHD here. She and her neurologist have discussed it and he's setting her up to have testing there at the [place].  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 Has tried numerous meds without success. Understandibly anxious b/c of h/o ortho surgery several years ago with improper anesthesia. Still anxious when thinks of that.   Anxiety in general is controlled. Needs Xanax routinely. If she doesn't take it, has severe anxiety and not able to function. Not having panic attacks most of the time.  Just generalized anxiety.  Patient denies loss of interest in usual activities and is able to enjoy things.  Denies decreased energy or motivation.  Appetite has not changed.  No extreme sadness, tearfulness, or feelings of hopelessness.Sleeps fine.  Denies suicidal or homicidal thoughts.   Patient denies increased energy with decreased need for sleep, no increased talkativeness, no racing thoughts, no impulsivity or risky behaviors, no increased spending, no increased libido, no grandiosity, no increased irritability or anger, and no hallucinations.   Denies dizziness, syncope, seizures, numbness, tingling, tremor, tics,  unsteady gait, slurred speech, confusion. Denies muscle or joint pain, stiffness, or dystonia.  Individual Medical History/ Review of Systems: Changes? :No    Past medications for mental health diagnoses include: Trintellix, Remeron, Xanax, Prazosin, Seroquel, Prozac, Wellbutrin, Contrave, Lunesta  Allergies: Chantix [varenicline tartrate], Varenicline, Augmentin [amoxicillin-pot clavulanate], Latex, and Tape  Current Medications:  Current Outpatient Medications:  .  albuterol (PROVENTIL HFA;VENTOLIN HFA) 108 (90 BASE) MCG/ACT inhaler, Inhale into the lungs every 6 (six) hours as needed for wheezing or shortness of breath., Disp: , Rfl:  .  ALPRAZolam (XANAX) 1 MG tablet, TAKE 1 TABLET BY MOUTH THREE TIMES DAILY AS NEEDED FOR ANXIETY AND FOR SLEEP, Disp: 90 tablet, Rfl: 5 .  budesonide-formoterol (SYMBICORT) 80-4.5 MCG/ACT inhaler, Inhale 2 puffs into the lungs 2 (two) times daily., Disp: , Rfl:  .  cetirizine (ZYRTEC) 10 MG tablet, Take 10 mg by mouth daily., Disp: , Rfl:  .  fexofenadine (ALLEGRA) 180 MG tablet, Take 180 mg by mouth daily., Disp: , Rfl:  .  levETIRAcetam (KEPPRA) 250 MG tablet, Take by mouth., Disp: , Rfl:  .  montelukast (SINGULAIR) 10 MG tablet, Take 10 mg by mouth at bedtime., Disp: , Rfl:  .  naproxen (NAPROSYN) 500 MG tablet, Take 1 tablet (500 mg total) by mouth 2 (two) times daily as needed. (Patient taking differently: Take 500 mg by mouth 2 (two) times daily as needed for mild pain. ), Disp: 30 tablet, Rfl: 0 .  naratriptan (AMERGE) 2.5 MG tablet, Take 2.5 mg by mouth as directed., Disp: , Rfl:  .  pantoprazole (PROTONIX) 40 MG tablet, Take 40 mg by mouth daily., Disp: ,  Rfl:  .  QUEtiapine (SEROQUEL) 300 MG tablet, Take 2 tablets (600 mg total) by mouth at bedtime., Disp: 180 tablet, Rfl: 1 .  topiramate (TOPAMAX) 100 MG tablet, Take 100-200 mg by mouth See admin instructions. Take 1 tablet every morning and at lunch then take 2 tablets at bedtime, Disp: , Rfl:  .   traZODone (DESYREL) 100 MG tablet, Take 1-2 tablets (100-200 mg total) by mouth at bedtime as needed for sleep., Disp: 180 tablet, Rfl: 1 .  verapamil (CALAN-SR) 120 MG CR tablet, Take 120 mg by mouth daily., Disp: , Rfl:  .  LINZESS 145 MCG CAPS capsule, Take 145 mcg by mouth every 3 (three) days.  (Patient not taking: Reported on 04/12/2020), Disp: , Rfl:  .  rizatriptan (MAXALT) 10 MG tablet, Take 10 mg by mouth as needed for migraine. May repeat in 2 hours if needed (Patient not taking: Reported on 04/12/2020), Disp: , Rfl:  Medication Side Effects: none  Family Medical/ Social History: Changes? No  MENTAL HEALTH EXAM:  There were no vitals taken for this visit.There is no height or weight on file to calculate BMI.  General Appearance: Casual, Neat, Well Groomed and Obese  Eye Contact:  Good  Speech:  Clear and Coherent and Normal Rate  Volume:  Normal  Mood:  Euthymic  Affect:  Appropriate  Thought Process:  Goal Directed and Descriptions of Associations: Intact  Orientation:  Full (Time, Place, and Person)  Thought Content: Logical   Suicidal Thoughts:  No  Homicidal Thoughts:  No  Memory:  WNL  Judgement:  Good  Insight:  Good  Psychomotor Activity:  Normal  Concentration:  Concentration: Good  Recall:  Good  Fund of Knowledge: Good  Language: Good  Assets:  Desire for Improvement  ADL's:  Intact  Cognition: WNL  Prognosis:  Good  10/05/19 Glucose was 106. Cholesterol was nl at [facility] recently.   DIAGNOSES:    ICD-10-CM   1. Posttraumatic stress disorder  F43.10   2. Generalized anxiety disorder  F41.1   3. Recurrent major depressive disorder, in partial remission (HCC)  F33.41   4. Migraine without status migrainosus, not intractable, unspecified migraine type  G43.909   5. Inattention  R41.840   6. Insomnia, unspecified type  G47.00   7. Partial symptomatic epilepsy with complex partial seizures, not intractable, without status epilepticus (HCC)  G40.209     Receiving  Psychotherapy: No    RECOMMENDATIONS:  PDMP reviewed. I provided 40 minutes of face to face time during this encounter. Discussed the possibility of ADHD. I'm glad she'll have formal neuropsychological testing through the [place]. She's aware that I do not give BZ stimulant routinely together. There are other tx for both disorders, that don't include controlled substances that essentially negate each other. We'll see what the neuropsych test results show and treat appropriately. Continue Xanax 1 mg, 1 po tid prn. Continue Seroquel 300 mg, 2 po qhs. Continue Trazodone 100 mg, 1-2 qhs prn sleep.  Continue follow-up with neuro for migraines. Return in 6 months.  Melony Overly, PA-C

## 2020-04-17 DIAGNOSIS — J209 Acute bronchitis, unspecified: Secondary | ICD-10-CM | POA: Diagnosis not present

## 2020-04-17 DIAGNOSIS — J069 Acute upper respiratory infection, unspecified: Secondary | ICD-10-CM | POA: Diagnosis not present

## 2020-04-17 DIAGNOSIS — Z20828 Contact with and (suspected) exposure to other viral communicable diseases: Secondary | ICD-10-CM | POA: Diagnosis not present

## 2020-07-11 ENCOUNTER — Other Ambulatory Visit: Payer: Self-pay | Admitting: Physician Assistant

## 2020-07-11 MED ORDER — TRAZODONE HCL 100 MG PO TABS: 100.0000 mg | ORAL_TABLET | Freq: Every evening | ORAL | 1 refills | Status: DC | PRN

## 2020-07-11 MED ORDER — QUETIAPINE FUMARATE 300 MG PO TABS: 600.0000 mg | ORAL_TABLET | Freq: Every day | ORAL | 1 refills | Status: DC

## 2020-09-10 DIAGNOSIS — R948 Abnormal results of function studies of other organs and systems: Secondary | ICD-10-CM | POA: Diagnosis not present

## 2020-09-10 DIAGNOSIS — R635 Abnormal weight gain: Secondary | ICD-10-CM | POA: Diagnosis not present

## 2020-09-25 ENCOUNTER — Telehealth: Payer: Self-pay | Admitting: Physician Assistant

## 2020-09-25 NOTE — Telephone Encounter (Signed)
Patient called back states that her pharmacy no longer accepts her insurance and would like for it to be send to [facility] instead [address]. Pharmacy ph [phone]

## 2020-09-25 NOTE — Telephone Encounter (Signed)
Next visit is 10/10/20. Requesting refill on Xanax called to [facility], [address] [place], [place], phone # is [phone].

## 2020-09-26 ENCOUNTER — Other Ambulatory Visit: Payer: Self-pay | Admitting: Physician Assistant

## 2020-09-26 DIAGNOSIS — G4733 Obstructive sleep apnea (adult) (pediatric): Secondary | ICD-10-CM | POA: Diagnosis not present

## 2020-09-26 DIAGNOSIS — R635 Abnormal weight gain: Secondary | ICD-10-CM | POA: Diagnosis not present

## 2020-09-26 DIAGNOSIS — Z6835 Body mass index (BMI) 35.0-35.9, adult: Secondary | ICD-10-CM | POA: Diagnosis not present

## 2020-09-26 DIAGNOSIS — K219 Gastro-esophageal reflux disease without esophagitis: Secondary | ICD-10-CM | POA: Diagnosis not present

## 2020-09-26 MED ORDER — ALPRAZOLAM 1 MG PO TABS: ORAL_TABLET | ORAL | 5 refills | Status: DC

## 2020-09-26 NOTE — Telephone Encounter (Signed)
Rx was sent  

## 2020-10-01 ENCOUNTER — Emergency Department (HOSPITAL_COMMUNITY)
Admission: EM | Admit: 2020-10-01 | Discharge: 2020-10-01 | Disposition: A | Discharge status: Home / Self Care | Payer: Medicare PPO | Attending: Emergency Medicine | Admitting: Emergency Medicine

## 2020-10-01 DIAGNOSIS — R109 Unspecified abdominal pain: Secondary | ICD-10-CM | POA: Insufficient documentation

## 2020-10-01 DIAGNOSIS — Z5321 Procedure and treatment not carried out due to patient leaving prior to being seen by health care provider: Secondary | ICD-10-CM | POA: Diagnosis not present

## 2020-10-01 LAB — CBC
HCT: 39.9 % (ref 36.0–46.0)
Hemoglobin: 12.7 g/dL (ref 12.0–15.0)
MCH: 30.2 pg (ref 26.0–34.0)
MCHC: 31.8 g/dL (ref 30.0–36.0)
MCV: 94.8 fL (ref 80.0–100.0)
Platelets: 264 10*3/uL (ref 150–400)
RBC: 4.21 MIL/uL (ref 3.87–5.11)
RDW: 13.2 % (ref 11.5–15.5)
WBC: 9.8 10*3/uL (ref 4.0–10.5)
nRBC: 0 % (ref 0.0–0.2)

## 2020-10-01 LAB — URINALYSIS, ROUTINE W REFLEX MICROSCOPIC
Bilirubin Urine: NEGATIVE
Glucose, UA: NEGATIVE mg/dL
Hgb urine dipstick: NEGATIVE
Ketones, ur: NEGATIVE mg/dL
Leukocytes,Ua: NEGATIVE
Nitrite: NEGATIVE
Protein, ur: NEGATIVE mg/dL
Specific Gravity, Urine: 1.015 (ref 1.005–1.030)
pH: 5 (ref 5.0–8.0)

## 2020-10-01 LAB — COMPREHENSIVE METABOLIC PANEL
ALT: 14 U/L (ref 0–44)
AST: 17 U/L (ref 15–41)
Albumin: 3.9 g/dL (ref 3.5–5.0)
Alkaline Phosphatase: 58 U/L (ref 38–126)
Anion gap: 8 (ref 5–15)
BUN: 19 mg/dL (ref 6–20)
CO2: 19 mmol/L — ABNORMAL LOW (ref 22–32)
Calcium: 9.2 mg/dL (ref 8.9–10.3)
Chloride: 110 mmol/L (ref 98–111)
Creatinine, Ser: 0.8 mg/dL (ref 0.44–1.00)
GFR, Estimated: >60 mL/min (ref >60)
Glucose, Bld: 80 mg/dL (ref 70–99)
Potassium: 3.9 mmol/L (ref 3.5–5.1)
Sodium: 137 mmol/L (ref 135–145)
Total Bilirubin: 0.3 mg/dL (ref 0.3–1.2)
Total Protein: 6.8 g/dL (ref 6.5–8.1)

## 2020-10-01 LAB — LIPASE, BLOOD: Lipase: 44 U/L (ref 11–51)

## 2020-10-01 MED ORDER — ONDANSETRON 4 MG PO TBDP
4.0000 mg | ORAL_TABLET | Freq: Once | ORAL | Status: AC | PRN
  Administered 2020-10-01: 4 mg via ORAL
  Filled 2020-10-01: qty 1

## 2020-10-01 MED ORDER — OXYCODONE-ACETAMINOPHEN 5-325 MG PO TABS
1.0000 | ORAL_TABLET | ORAL | Status: DC | PRN
  Administered 2020-10-01: 1 via ORAL
  Filled 2020-10-01: qty 1

## 2020-10-01 NOTE — ED Triage Notes (Addendum)
Pt reports lefty flank pain that radiates to her pelvic with nausea starting today. Pt wants to be evaluated to make sure it is not a rupture cyst,Pt states she has an hx

## 2020-10-01 NOTE — ED Notes (Signed)
Pt notified staff that she was leaving 

## 2020-10-10 ENCOUNTER — Ambulatory Visit (INDEPENDENT_AMBULATORY_CARE_PROVIDER_SITE_OTHER): Payer: Medicare PPO | Admitting: Physician Assistant

## 2020-10-10 ENCOUNTER — Other Ambulatory Visit: Payer: Self-pay

## 2020-10-10 ENCOUNTER — Encounter: Payer: Self-pay | Admitting: Physician Assistant

## 2020-10-10 DIAGNOSIS — F411 Generalized anxiety disorder: Secondary | ICD-10-CM | POA: Diagnosis not present

## 2020-10-10 DIAGNOSIS — F4310 Post-traumatic stress disorder, unspecified: Secondary | ICD-10-CM | POA: Diagnosis not present

## 2020-10-10 DIAGNOSIS — F3341 Major depressive disorder, recurrent, in partial remission: Secondary | ICD-10-CM

## 2020-10-10 MED ORDER — QUETIAPINE FUMARATE 300 MG PO TABS: 600.0000 mg | ORAL_TABLET | Freq: Every day | ORAL | 1 refills | Status: DC

## 2020-10-10 MED ORDER — TRAZODONE HCL 100 MG PO TABS: 100.0000 mg | ORAL_TABLET | Freq: Every evening | ORAL | 1 refills | Status: DC | PRN

## 2020-10-10 NOTE — Progress Notes (Signed)
[facility]  Patient ID: Jill Bruce,  MRN: [id number]  PCP: [facility]  Date of Evaluation: 10/10/2020 Time spent:20 minutes  Chief Complaint:  Chief Complaint    Anxiety; Depression; Insomnia; Follow-up      HISTORY/CURRENT STATUS: For 54-month med check.  Hasn't had the colectomy yet (has slow transient constipation) b/c she has to get to below 200#. In a weight loss clinic through [facility].  She had to have her metabolism test through the weight loss clinic.  That test was not pleasant as she had to breathe through a tube with her nose clamped.  That lasted for 16 minutes and she was panicky about the time it was over.  She did not know it was going to happen.  Prior to that the anxiety had been much better controlled.  The Xanax does help when she takes it.  Feels like she just needs to work through what happened and she will be okay.    She and her husband are celebrating their 20th anniversary this year.  They will be going on a cruise to celebrate.  Their oldest son is graduating from high school in a few months and is going to college at [employer].  Patient denies loss of interest in usual activities and is able to enjoy things.  Denies decreased energy or motivation.  Appetite has not changed.  No extreme sadness, tearfulness, or feelings of hopelessness. Sleeps fine.  Denies suicidal or homicidal thoughts.   Patient denies increased energy with decreased need for sleep, no increased talkativeness, no racing thoughts, no impulsivity or risky behaviors, no increased spending, no increased libido, no grandiosity, no increased irritability or anger, and no hallucinations.   Denies dizziness, syncope, seizures, numbness, tingling, tremor, tics, unsteady gait, slurred speech, confusion. Denies muscle or joint pain, stiffness, or dystonia.  Individual Medical History/ Review of Systems: Changes? :Yes   normal pressure glaucoma.   Past  medications for mental health diagnoses include: Trintellix, Remeron, Xanax, Prazosin, Seroquel, Prozac, Wellbutrin, Contrave, Lunesta  Allergies: Chantix [varenicline tartrate], Varenicline, Augmentin [amoxicillin-pot clavulanate], Latex, and Tape  Current Medications:  Current Outpatient Medications:  .  albuterol (PROVENTIL HFA;VENTOLIN HFA) 108 (90 BASE) MCG/ACT inhaler, Inhale into the lungs every 6 (six) hours as needed for wheezing or shortness of breath., Disp: , Rfl:  .  ALPRAZolam (XANAX) 1 MG tablet, TAKE 1 TABLET BY MOUTH THREE TIMES DAILY AS NEEDED FOR ANXIETY AND FOR SLEEP, Disp: 90 tablet, Rfl: 5 .  budesonide-formoterol (SYMBICORT) 80-4.5 MCG/ACT inhaler, Inhale 2 puffs into the lungs 2 (two) times daily., Disp: , Rfl:  .  cetirizine (ZYRTEC) 10 MG tablet, Take 10 mg by mouth daily., Disp: , Rfl:  .  fexofenadine (ALLEGRA) 180 MG tablet, Take 180 mg by mouth daily., Disp: , Rfl:  .  levETIRAcetam (KEPPRA) 250 MG tablet, Take by mouth., Disp: , Rfl:  .  montelukast (SINGULAIR) 10 MG tablet, Take 10 mg by mouth at bedtime., Disp: , Rfl:  .  naproxen (NAPROSYN) 500 MG tablet, Take 1 tablet (500 mg total) by mouth 2 (two) times daily as needed. (Patient taking differently: Take 500 mg by mouth 2 (two) times daily as needed for mild pain.), Disp: 30 tablet, Rfl: 0 .  naratriptan (AMERGE) 2.5 MG tablet, Take 2.5 mg by mouth as directed., Disp: , Rfl:  .  pantoprazole (PROTONIX) 40 MG tablet, Take 40 mg by mouth daily., Disp: , Rfl:  .  topiramate (TOPAMAX) 100 MG tablet,  Take 100-200 mg by mouth See admin instructions. Take 1 tablet every morning and at lunch then take 2 tablets at bedtime, Disp: , Rfl:  .  verapamil (CALAN-SR) 120 MG CR tablet, Take 120 mg by mouth daily., Disp: , Rfl:  .  LINZESS 145 MCG CAPS capsule, Take 145 mcg by mouth every 3 (three) days.  (Patient not taking: No sig reported), Disp: , Rfl:  .  QUEtiapine (SEROQUEL) 300 MG tablet, Take 2 tablets (600 mg total)  by mouth at bedtime., Disp: 180 tablet, Rfl: 1 .  rizatriptan (MAXALT) 10 MG tablet, Take 10 mg by mouth as needed for migraine. May repeat in 2 hours if needed (Patient not taking: No sig reported), Disp: , Rfl:  .  traZODone (DESYREL) 100 MG tablet, Take 1-2 tablets (100-200 mg total) by mouth at bedtime as needed for sleep., Disp: 180 tablet, Rfl: 1 Medication Side Effects: none  Family Medical/ Social History: Changes? No  MENTAL HEALTH EXAM:  There were no vitals taken for this visit.There is no height or weight on file to calculate BMI.  General Appearance: Casual, Neat, Well Groomed and Obese  Eye Contact:  Good  Speech:  Clear and Coherent and Normal Rate  Volume:  Normal  Mood:  Euthymic  Affect:  Appropriate  Thought Process:  Goal Directed and Descriptions of Associations: Intact  Orientation:  Full (Time, Place, and Person)  Thought Content: Logical   Suicidal Thoughts:  No  Homicidal Thoughts:  No  Memory:  WNL  Judgement:  Good  Insight:  Good  Psychomotor Activity:  Normal  Concentration:  Concentration: Good and Attention Span: Good  Recall:  Good  Fund of Knowledge: Good  Language: Good  Assets:  Desire for Improvement  ADL's:  Intact  Cognition: WNL  Prognosis:  Good     DIAGNOSES:    ICD-10-CM   1. Generalized anxiety disorder  F41.1   2. Recurrent major depressive disorder, in partial remission (HCC)  F33.41   3. Posttraumatic stress disorder  F43.10     Receiving Psychotherapy: No    RECOMMENDATIONS:  PDMP reviewed. I provided 20 mins of face to face time during this encounter including time spent before and after the visit in records review and charting.  She has labs done through the [place], no need to repeat.  States they have been fine. Discussed to the anxiety.  We agree to not change any medication doses to see how she does with this, if she does not improve in the next month or so then she should call.  Reminded her to take the Xanax as  needed, she is hesitant but I think it is necessary right now to break the cycle of anxiety. Continue Xanax 1 mg, 1 po tid prn. Continue Seroquel 300 mg, 2 po qhs. Continue Trazodone 100 mg, 1-2 qhs prn sleep.  Return in 6 months.  Melony Overly, PA-C

## 2020-10-15 ENCOUNTER — Other Ambulatory Visit: Payer: Self-pay | Admitting: Physician Assistant

## 2020-10-15 ENCOUNTER — Telehealth: Payer: Self-pay | Admitting: Physician Assistant

## 2020-10-15 NOTE — Telephone Encounter (Signed)
Next visit is 04/07/21. Requesting refill on Xanax called to:  [facility] [address] [facility], [place] - [address] Phone:  [phone]  Fax:  [phone]

## 2020-10-15 NOTE — Telephone Encounter (Signed)
Controlled substance 

## 2020-10-15 NOTE — Telephone Encounter (Signed)
It was sent

## 2020-10-15 NOTE — Telephone Encounter (Signed)
Please review

## 2020-10-17 DIAGNOSIS — E669 Obesity, unspecified: Secondary | ICD-10-CM | POA: Diagnosis not present

## 2020-10-17 DIAGNOSIS — R5382 Chronic fatigue, unspecified: Secondary | ICD-10-CM | POA: Diagnosis not present

## 2020-10-17 DIAGNOSIS — G4733 Obstructive sleep apnea (adult) (pediatric): Secondary | ICD-10-CM | POA: Diagnosis not present

## 2020-10-17 DIAGNOSIS — K219 Gastro-esophageal reflux disease without esophagitis: Secondary | ICD-10-CM | POA: Diagnosis not present

## 2020-11-06 DIAGNOSIS — Z713 Dietary counseling and surveillance: Secondary | ICD-10-CM | POA: Diagnosis not present

## 2020-11-06 DIAGNOSIS — E669 Obesity, unspecified: Secondary | ICD-10-CM | POA: Diagnosis not present

## 2020-11-06 DIAGNOSIS — K5909 Other constipation: Secondary | ICD-10-CM | POA: Diagnosis not present

## 2020-11-06 DIAGNOSIS — K219 Gastro-esophageal reflux disease without esophagitis: Secondary | ICD-10-CM | POA: Diagnosis not present

## 2020-11-13 ENCOUNTER — Other Ambulatory Visit: Payer: Self-pay | Admitting: Physician Assistant

## 2020-11-13 DIAGNOSIS — Z1152 Encounter for screening for COVID-19: Secondary | ICD-10-CM | POA: Diagnosis not present

## 2020-11-13 DIAGNOSIS — T189XXA Foreign body of alimentary tract, part unspecified, initial encounter: Secondary | ICD-10-CM | POA: Diagnosis not present

## 2020-11-13 DIAGNOSIS — Z20828 Contact with and (suspected) exposure to other viral communicable diseases: Secondary | ICD-10-CM | POA: Diagnosis not present

## 2020-11-15 NOTE — Telephone Encounter (Signed)
Last filled 10/15/20 appt 04/07/21

## 2020-11-26 DIAGNOSIS — K5901 Slow transit constipation: Secondary | ICD-10-CM | POA: Diagnosis not present

## 2020-11-26 DIAGNOSIS — K5909 Other constipation: Secondary | ICD-10-CM | POA: Diagnosis not present

## 2020-12-06 DIAGNOSIS — K219 Gastro-esophageal reflux disease without esophagitis: Secondary | ICD-10-CM | POA: Diagnosis not present

## 2020-12-06 DIAGNOSIS — Z713 Dietary counseling and surveillance: Secondary | ICD-10-CM | POA: Diagnosis not present

## 2020-12-06 DIAGNOSIS — E669 Obesity, unspecified: Secondary | ICD-10-CM | POA: Diagnosis not present

## 2020-12-06 DIAGNOSIS — K5909 Other constipation: Secondary | ICD-10-CM | POA: Diagnosis not present

## 2020-12-12 DIAGNOSIS — R5382 Chronic fatigue, unspecified: Secondary | ICD-10-CM | POA: Diagnosis not present

## 2020-12-12 DIAGNOSIS — K219 Gastro-esophageal reflux disease without esophagitis: Secondary | ICD-10-CM | POA: Diagnosis not present

## 2020-12-12 DIAGNOSIS — G4733 Obstructive sleep apnea (adult) (pediatric): Secondary | ICD-10-CM | POA: Diagnosis not present

## 2020-12-12 DIAGNOSIS — E669 Obesity, unspecified: Secondary | ICD-10-CM | POA: Diagnosis not present

## 2020-12-17 ENCOUNTER — Other Ambulatory Visit: Payer: Self-pay | Admitting: Physician Assistant

## 2020-12-17 NOTE — Telephone Encounter (Signed)
Last filled 11/24/20

## 2020-12-18 DIAGNOSIS — Z1152 Encounter for screening for COVID-19: Secondary | ICD-10-CM | POA: Diagnosis not present

## 2020-12-18 DIAGNOSIS — Z20828 Contact with and (suspected) exposure to other viral communicable diseases: Secondary | ICD-10-CM | POA: Diagnosis not present

## 2021-01-09 DIAGNOSIS — K5909 Other constipation: Secondary | ICD-10-CM | POA: Diagnosis not present

## 2021-01-09 DIAGNOSIS — E669 Obesity, unspecified: Secondary | ICD-10-CM | POA: Diagnosis not present

## 2021-01-09 DIAGNOSIS — Z713 Dietary counseling and surveillance: Secondary | ICD-10-CM | POA: Diagnosis not present

## 2021-02-13 DIAGNOSIS — Z713 Dietary counseling and surveillance: Secondary | ICD-10-CM | POA: Diagnosis not present

## 2021-02-13 DIAGNOSIS — K5909 Other constipation: Secondary | ICD-10-CM | POA: Diagnosis not present

## 2021-02-13 DIAGNOSIS — E669 Obesity, unspecified: Secondary | ICD-10-CM | POA: Diagnosis not present

## 2021-02-13 DIAGNOSIS — R5382 Chronic fatigue, unspecified: Secondary | ICD-10-CM | POA: Diagnosis not present

## 2021-02-15 DIAGNOSIS — J45901 Unspecified asthma with (acute) exacerbation: Secondary | ICD-10-CM | POA: Diagnosis not present

## 2021-03-01 ENCOUNTER — Other Ambulatory Visit: Payer: Self-pay | Admitting: Physician Assistant

## 2021-03-03 NOTE — Telephone Encounter (Signed)
Last filled 7/7

## 2021-03-25 ENCOUNTER — Encounter (HOSPITAL_COMMUNITY): Payer: Self-pay | Admitting: Emergency Medicine

## 2021-03-25 ENCOUNTER — Emergency Department (HOSPITAL_COMMUNITY)
Admission: EM | Admit: 2021-03-25 | Discharge: 2021-03-25 | Disposition: A | Discharge status: Home / Self Care | Payer: Medicare PPO | Attending: Emergency Medicine | Admitting: Emergency Medicine

## 2021-03-25 ENCOUNTER — Other Ambulatory Visit: Payer: Self-pay

## 2021-03-25 ENCOUNTER — Emergency Department (HOSPITAL_COMMUNITY): Payer: Medicare PPO

## 2021-03-25 DIAGNOSIS — R059 Cough, unspecified: Secondary | ICD-10-CM | POA: Diagnosis not present

## 2021-03-25 DIAGNOSIS — Z20822 Contact with and (suspected) exposure to covid-19: Secondary | ICD-10-CM | POA: Diagnosis not present

## 2021-03-25 DIAGNOSIS — J4521 Mild intermittent asthma with (acute) exacerbation: Secondary | ICD-10-CM

## 2021-03-25 DIAGNOSIS — Z91040 Latex allergy status: Secondary | ICD-10-CM | POA: Insufficient documentation

## 2021-03-25 DIAGNOSIS — F17210 Nicotine dependence, cigarettes, uncomplicated: Secondary | ICD-10-CM | POA: Diagnosis not present

## 2021-03-25 DIAGNOSIS — R0602 Shortness of breath: Secondary | ICD-10-CM | POA: Diagnosis not present

## 2021-03-25 LAB — RESPIRATORY PANEL BY PCR
Adenovirus: NOT DETECTED
Bordetella Parapertussis: NOT DETECTED
Bordetella pertussis: NOT DETECTED
Chlamydophila pneumoniae: NOT DETECTED
Coronavirus 229E: NOT DETECTED
Coronavirus HKU1: NOT DETECTED
Coronavirus NL63: NOT DETECTED
Coronavirus OC43: NOT DETECTED
Influenza A H1 2009: NOT DETECTED
Influenza A H1: NOT DETECTED
Influenza A H3: NOT DETECTED
Influenza A: NOT DETECTED
Influenza B: NOT DETECTED
Metapneumovirus: NOT DETECTED
Mycoplasma pneumoniae: NOT DETECTED
Parainfluenza Virus 1: NOT DETECTED
Parainfluenza Virus 2: NOT DETECTED
Parainfluenza Virus 3: NOT DETECTED
Parainfluenza Virus 4: NOT DETECTED
Respiratory Syncytial Virus: NOT DETECTED
Rhinovirus / Enterovirus: NOT DETECTED

## 2021-03-25 MED ORDER — DOXYCYCLINE HYCLATE 100 MG PO CAPS: 100.0000 mg | ORAL_CAPSULE | Freq: Two times a day (BID) | ORAL | 0 refills | Status: DC

## 2021-03-25 MED ORDER — BENZONATATE 100 MG PO CAPS: 100.0000 mg | ORAL_CAPSULE | Freq: Three times a day (TID) | ORAL | 0 refills | Status: DC

## 2021-03-25 MED ORDER — PREDNISONE 20 MG PO TABS: 40.0000 mg | ORAL_TABLET | Freq: Every day | ORAL | 0 refills | Status: DC

## 2021-03-25 MED ORDER — IPRATROPIUM-ALBUTEROL 0.5-2.5 (3) MG/3ML IN SOLN
3.0000 mL | Freq: Once | RESPIRATORY_TRACT | Status: AC
  Administered 2021-03-25: 3 mL via RESPIRATORY_TRACT
  Filled 2021-03-25: qty 3

## 2021-03-25 MED ORDER — ALBUTEROL SULFATE HFA 108 (90 BASE) MCG/ACT IN AERS: 1.0000 | INHALATION_SPRAY | Freq: Four times a day (QID) | RESPIRATORY_TRACT | 0 refills | Status: AC | PRN

## 2021-03-25 MED ORDER — ALBUTEROL SULFATE HFA 108 (90 BASE) MCG/ACT IN AERS: 2.0000 | INHALATION_SPRAY | RESPIRATORY_TRACT | Status: DC | PRN

## 2021-03-25 NOTE — ED Provider Notes (Signed)
[facility] EMERGENCY DEPARTMENT Provider Note   CSN: [id number] Arrival date & time: 03/25/21  0940     History Chief Complaint  Patient presents with   Asthma   Cough    Jill Bruce is a 40 y.o. female with h/o asthma presents to the ED for eval of occasional productive cough, SOB,  wheezing, and rhinorrhea for the past three days. For her asthma and allergies, she takes Zyrtec, Claritin, Montelukast, and albuterol. Denies any fevers, chills, chest pain, abdominal pain, nausea, or vomiting. She denies any sick contacts and is vaccinated for flu and COVID.    Asthma Associated symptoms include shortness of breath. Pertinent negatives include no chest pain.  Cough Associated symptoms: rhinorrhea and shortness of breath   Associated symptoms: no chest pain, no chills, no ear pain, no fever and no sore throat       Past Medical History:  Diagnosis Date   Allergy    Anxiety    Arthritis    Asthma    Avascular necrosis (HCC)    Depression    Fibromyalgia    GERD (gastroesophageal reflux disease)    Migraine    Post traumatic stress disorder (PTSD)     Patient Active Problem List   Diagnosis Date Noted   Posttraumatic stress disorder 05/14/2018   Generalized anxiety disorder 05/14/2018   Migraine 07/18/2015   Obesity, Class III, BMI 40-49.9 (morbid obesity) (HCC) 12/07/2013    Past Surgical History:  Procedure Laterality Date   ABDOMINAL HYSTERECTOMY     2012   BLADDER SUSPENSION     2012   CHOLECYSTECTOMY     2009   FOOT SURGERY     2011   JOINT REPLACEMENT     Right Hip and Right knee - both 2015   TONSILLECTOMY     TUBAL LIGATION     2012     OB History     Gravida  3   Para  3   Term  3   Preterm      AB      Living  3      SAB      IAB      Ectopic      Multiple      Live Births              Family History  Problem Relation Age of Onset   COPD Father    Heart disease Father    Stroke  Father    Heart attack Father    Sleep apnea Father    Kidney failure Father    Diabetes Other        Paternal aunt and uncle   Alzheimer's disease Paternal Grandmother     Social History   Tobacco Use   Smoking status: Every Day    Packs/day: 1.00    Types: E-cigarettes, Cigarettes    Last attempt to quit: 05/09/2008    Years since quitting: 12.8   Smokeless tobacco: Never  Vaping Use   Vaping Use: Never used  Substance Use Topics   Alcohol use: Yes    Alcohol/week: 0.0 standard drinks    Comment: once q 2-3 months   Drug use: No    Home Medications Prior to Admission medications   Medication Sig Start Date End Date Taking? Authorizing Provider  albuterol (VENTOLIN HFA) 108 (90 Base) MCG/ACT inhaler Inhale 1-2 puffs into the lungs every 6 (six) hours as needed for wheezing or  shortness of breath. 03/25/21   Achille Rich, PA-C  ALPRAZolam Prudy Feeler) 1 MG tablet TAKE 1 TABLET BY MOUTH THREE TIMES DAILY AS NEEDED FOR ANXIETY AND FOR SLEEP 03/03/21   Melony Overly T, PA-C  benzonatate (TESSALON) 100 MG capsule Take 1 capsule (100 mg total) by mouth every 8 (eight) hours. 03/25/21   Eber Hong, MD  budesonide-formoterol [facility]) 80-4.5 MCG/ACT inhaler Inhale 2 puffs into the lungs 2 (two) times daily.    [provider]  cetirizine (ZYRTEC) 10 MG tablet Take 10 mg by mouth daily.    [provider]  doxycycline (VIBRAMYCIN) 100 MG capsule Take 1 capsule (100 mg total) by mouth 2 (two) times daily. 03/25/21   Eber Hong, MD  fexofenadine (ALLEGRA) 180 MG tablet Take 180 mg by mouth daily.    [provider]  levETIRAcetam (KEPPRA) 250 MG tablet Take by mouth. 03/17/20   [provider]  LINZESS 145 MCG CAPS capsule Take 145 mcg by mouth every 3 (three) days.  Patient not taking: No sig reported 11/22/18   [provider]  montelukast (SINGULAIR) 10 MG tablet Take 10 mg by mouth at bedtime.    [provider]  naproxen  (NAPROSYN) 500 MG tablet Take 1 tablet (500 mg total) by mouth 2 (two) times daily as needed. Patient taking differently: Take 500 mg by mouth 2 (two) times daily as needed for mild pain. 11/26/16   Judeth Horn, NP  naratriptan (AMERGE) 2.5 MG tablet Take 2.5 mg by mouth as directed. 02/13/20   [provider]  pantoprazole (PROTONIX) 40 MG tablet Take 40 mg by mouth daily.    [provider]  predniSONE (DELTASONE) 20 MG tablet Take 2 tablets (40 mg total) by mouth daily. 03/25/21   Eber Hong, MD  QUEtiapine (SEROQUEL) 300 MG tablet Take 2 tablets (600 mg total) by mouth at bedtime. 10/10/20   Melony Overly T, PA-C  rizatriptan (MAXALT) 10 MG tablet Take 10 mg by mouth as needed for migraine. May repeat in 2 hours if needed Patient not taking: No sig reported    [provider]  topiramate (TOPAMAX) 100 MG tablet Take 100-200 mg by mouth See admin instructions. Take 1 tablet every morning and at lunch then take 2 tablets at bedtime    [provider]  traZODone (DESYREL) 100 MG tablet Take 1-2 tablets (100-200 mg total) by mouth at bedtime as needed for sleep. 10/10/20   Melony Overly T, PA-C  verapamil (CALAN-SR) 120 MG CR tablet Take 120 mg by mouth daily. 10/11/19   [provider]    Allergies    Chantix [varenicline tartrate], Varenicline, Augmentin [amoxicillin-pot clavulanate], Latex, and Tape  Review of Systems   Review of Systems  Constitutional:  Negative for chills and fever.  HENT:  Positive for rhinorrhea. Negative for congestion, ear pain and sore throat.   Respiratory:  Positive for cough and shortness of breath.   Cardiovascular:  Negative for chest pain and palpitations.  Gastrointestinal:  Negative for diarrhea, nausea and vomiting.  All other systems reviewed and are negative.  Physical Exam Updated Vital Signs BP 110/78 (BP Location: Left Arm)   Pulse (!) 107   Temp 98.5 F (36.9 C) (Oral)   Resp 15   SpO2 99%    Physical Exam Vitals and nursing note reviewed.  Constitutional:      General: She is not in acute distress.    Appearance: Normal appearance. She is not toxic-appearing.  HENT:  Right Ear: Tympanic membrane, ear canal and external ear normal.     Left Ear: Tympanic membrane, ear canal and external ear normal.     Nose: Rhinorrhea present.     Mouth/Throat:     Mouth: Mucous membranes are moist.     Pharynx: Oropharynx is clear. No oropharyngeal exudate or posterior oropharyngeal erythema.  Eyes:     General: No scleral icterus. Cardiovascular:     Rate and Rhythm: Regular rhythm. Tachycardia present.  Pulmonary:     Effort: Pulmonary effort is normal. No respiratory distress.     Breath sounds: Wheezing present.     Comments: Diffused inspiratory and expiratory wheezing bilaterally. No tripoding, accessory muscle use, or nasal flaring.  Abdominal:     General: Abdomen is flat.     Palpations: Abdomen is soft.  Musculoskeletal:     Cervical back: Normal range of motion.  Skin:    General: Skin is dry.     Findings: No rash.  Neurological:     General: No focal deficit present.     Mental Status: She is alert. Mental status is at baseline.  Psychiatric:        Mood and Affect: Mood normal.    ED Results / Procedures / Treatments   Labs (all labs ordered are listed, but only abnormal results are displayed) Labs Reviewed  RESPIRATORY PANEL BY PCR    EKG EKG Interpretation  Date/Time:  Tuesday March 25 2021 09:47:56 EDT Ventricular Rate:  119 PR Interval:  132 QRS Duration: 76 QT Interval:  262 QTC Calculation: 368 R Axis:   76 Text Interpretation: Sinus tachycardia T wave abnormality, consider inferior ischemia Abnormal ECG Since last tracing rate faster Confirmed by Eber Hong ([id number]) on 03/25/2021 12:05:53 PM  Radiology DG Chest 2 View  Result Date: 03/25/2021 CLINICAL DATA:  Shortness of breath and cough for 2 days EXAM: CHEST - 2 VIEW COMPARISON:   12/02/2019 FINDINGS: The heart size and mediastinal contours are within normal limits. Both lungs are clear. The visualized skeletal structures are unremarkable. IMPRESSION: No active cardiopulmonary disease. Electronically Signed   By: Alcide Clever M.D.   On: 03/25/2021 10:25    Procedures Procedures   Medications Ordered in ED Medications  ipratropium-albuterol (DUONEB) 0.5-2.5 (3) MG/3ML nebulizer solution 3 mL (3 mLs Nebulization Given 03/25/21 1303)    ED Course  I have reviewed the triage vital signs and the nursing notes.  Pertinent labs & imaging results that were available during my care of the patient were reviewed by me and considered in my medical decision making (see chart for details).  Jill Bruce is a 40 y/o F h/o asthma presents to the ED for eval of occasional productive cough, SOB,  wheezing, and rhinorrhea for the past three days. Differential includes asthma exacerbation, pneumonia, pneumothorax, flu, COVID, respiratory viral illness.   I personally reviewed the imaging for this patient. Mild perihilar thickening, but no evidence of pneumonia, pneumothorax, or other acute cardiopulmonary process.   Physical exam revealed moderate inspiratory and expiratory wheezing. No respiratory distress or accessory muscle use. O2 sat at 95%.   Last dose of Prednisone was three weeks. 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 Recommended follow up with her allergist. Instructed patient to follow up with her MyChart to see the results of her respiratory panel. If the panel is negative, she will take prednisone, albuterol, and doxycycline . If positive, she  will take the prednisone and albuterol. Discussed plan with patient and she is agreeable. The patient is stable and is being discharged in good condition.    MDM Rules/Calculators/A&P                          Final Clinical Impression(s) / ED Diagnoses Final  diagnoses:  Cough  Mild intermittent asthma with exacerbation    Rx / DC Orders ED Discharge Orders          Ordered    doxycycline (VIBRAMYCIN) 100 MG capsule  2 times daily,   Status:  Discontinued        03/25/21 1300    predniSONE (DELTASONE) 20 MG tablet  Daily,   Status:  Discontinued        03/25/21 1300    benzonatate (TESSALON) 100 MG capsule  Every 8 hours,   Status:  Discontinued        03/25/21 1300    albuterol (VENTOLIN HFA) 108 (90 Base) MCG/ACT inhaler  Every 6 hours PRN        03/25/21 1300    predniSONE (DELTASONE) 20 MG tablet  Daily        03/25/21 1326    doxycycline (VIBRAMYCIN) 100 MG capsule  2 times daily        03/25/21 1326    benzonatate (TESSALON) 100 MG capsule  Every 8 hours        03/25/21 1326             Achille Rich, PA-C 03/25/21 1615    Eber Hong, MD 03/28/21 [phone]

## 2021-03-25 NOTE — ED Triage Notes (Addendum)
Pt states she has felt her asthma flaring up over the past few days. Pt very anxious in triage. States she feels a little chest congestion as well and SOB.

## 2021-03-25 NOTE — ED Provider Notes (Signed)
Medical screening examination/treatment/procedure(s) were conducted as a shared visit with non-physician practitioner(s) and myself.  I personally evaluated the patient during the encounter.  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  She was in sick for approximately 2 or 3 weeks, the history is somewhat vague and that the patient changes the story from time to time but ultimately went on to prednisone about 3 weeks ago, this was because of wheezing and coughing.  This is not improved in fact over the last couple of days she seems to have gotten worse.  That being said on exam the patient has an oxygen of 95%, she has inspiratory and expiratory wheezing but speaks in full sentences without accessory muscle use.  Her heart rate for me is around 95 and she is afebrile with a normal chest x-ray.  I discussed with the patient the value of doing viral testing, this will be sent prior to the patient being discharged.  If testing is negative she can take prednisone albuterol and follow-up with her pulmonologist, if testing is positive I would add doxycycline to the treatment regimen, the patient is agreeable to this plan        Eber Hong, MD 03/28/21 [phone]

## 2021-04-01 DIAGNOSIS — K5901 Slow transit constipation: Secondary | ICD-10-CM | POA: Diagnosis not present

## 2021-04-07 ENCOUNTER — Other Ambulatory Visit: Payer: Self-pay

## 2021-04-07 ENCOUNTER — Ambulatory Visit (INDEPENDENT_AMBULATORY_CARE_PROVIDER_SITE_OTHER): Payer: Medicare PPO | Admitting: Physician Assistant

## 2021-04-07 ENCOUNTER — Encounter: Payer: Self-pay | Admitting: Physician Assistant

## 2021-04-07 DIAGNOSIS — F4310 Post-traumatic stress disorder, unspecified: Secondary | ICD-10-CM | POA: Diagnosis not present

## 2021-04-07 DIAGNOSIS — G43909 Migraine, unspecified, not intractable, without status migrainosus: Secondary | ICD-10-CM | POA: Diagnosis not present

## 2021-04-07 DIAGNOSIS — F4323 Adjustment disorder with mixed anxiety and depressed mood: Secondary | ICD-10-CM

## 2021-04-07 MED ORDER — ALPRAZOLAM 1 MG PO TABS: 1.0000 mg | ORAL_TABLET | Freq: Three times a day (TID) | ORAL | 2 refills | Status: DC | PRN

## 2021-04-07 MED ORDER — TRAZODONE HCL 100 MG PO TABS: 100.0000 mg | ORAL_TABLET | Freq: Every evening | ORAL | 1 refills | Status: DC | PRN

## 2021-04-07 MED ORDER — QUETIAPINE FUMARATE 300 MG PO TABS: 600.0000 mg | ORAL_TABLET | Freq: Every day | ORAL | 1 refills | Status: DC

## 2021-04-07 NOTE — Progress Notes (Signed)
[facility] Med Check  Patient ID: Jill Bruce,  MRN: [id number]  PCP: [facility]  Date of Evaluation: 04/07/2021 Time spent:30 minutes  Chief Complaint:  Chief Complaint   Anxiety; Depression; Follow-up      HISTORY/CURRENT STATUS: For 45-month med check.  Was doing well until her son moved out to go to college about 4-6 wks ago. Misses him, but there's a lot of emotions going on. She and husband are very proud of him and they wanted him to go off to school. Still misses him though. Also brought up a lot of memories of what happened to her after high school, went in [employer], had a horrible childhood and she needed to get out. She and husband and kids are very close. So just sad.   Patient denies loss of interest in usual activities and is able to enjoy things.  Denies decreased energy or motivation.  Appetite has not changed. No feelings of hopelessness.  Denies any changes in concentration, making decisions or remembering things.  Anxiety is very controlled with the Xanax. Sleeps well. Does worry about her son but no reason to, just that he's not living at home.  Denies suicidal or homicidal thoughts.  Patient denies increased energy with decreased need for sleep, no increased talkativeness, no racing thoughts, no impulsivity or risky behaviors, no increased spending, no increased libido, no grandiosity, no increased irritability or anger, and no hallucinations.   Denies dizziness, syncope, seizures, numbness, tingling, tremor, tics, unsteady gait, slurred speech, confusion. Denies muscle or joint pain, stiffness, or dystonia.  Individual Medical History/ Review of Systems: Changes? :Yes   she had a bad asthma attack not long ago.  Much improved now.  Past medications for mental health diagnoses include: Trintellix, Remeron, Xanax, Prazosin, Seroquel, Prozac, Wellbutrin, Contrave, Lunesta  Allergies: Chantix [varenicline tartrate], Varenicline, Augmentin  [amoxicillin-pot clavulanate], Latex, and Tape  Current Medications:  Current Outpatient Medications:    albuterol (VENTOLIN HFA) 108 (90 Base) MCG/ACT inhaler, Inhale 1-2 puffs into the lungs every 6 (six) hours as needed for wheezing or shortness of breath., Disp: 1 each, Rfl: 0   budesonide-formoterol (SYMBICORT) 80-4.5 MCG/ACT inhaler, Inhale 2 puffs into the lungs 2 (two) times daily., Disp: , Rfl:    cetirizine (ZYRTEC) 10 MG tablet, Take 10 mg by mouth daily., Disp: , Rfl:    fexofenadine (ALLEGRA) 180 MG tablet, Take 180 mg by mouth daily., Disp: , Rfl:    levETIRAcetam (KEPPRA) 250 MG tablet, Take by mouth in the morning, at noon, and at bedtime., Disp: , Rfl:    montelukast (SINGULAIR) 10 MG tablet, Take 10 mg by mouth at bedtime., Disp: , Rfl:    naproxen (NAPROSYN) 500 MG tablet, Take 1 tablet (500 mg total) by mouth 2 (two) times daily as needed. (Patient taking differently: Take 500 mg by mouth 2 (two) times daily as needed for mild pain.), Disp: 30 tablet, Rfl: 0   pantoprazole (PROTONIX) 40 MG tablet, Take 40 mg by mouth daily., Disp: , Rfl:    topiramate (TOPAMAX) 100 MG tablet, Take 100-200 mg by mouth See admin instructions. Take 1 tablet every morning and at lunch then take 2 tablets at bedtime, Disp: , Rfl:    verapamil (CALAN-SR) 120 MG CR tablet, Take 120 mg by mouth daily., Disp: , Rfl:    ALPRAZolam (XANAX) 1 MG tablet, Take 1 tablet (1 mg total) by mouth 3 (three) times daily as needed for anxiety., Disp: 90 tablet, Rfl: 2  LINZESS 145 MCG CAPS capsule, Take 145 mcg by mouth every 3 (three) days.  (Patient not taking: No sig reported), Disp: , Rfl:    naratriptan (AMERGE) 2.5 MG tablet, Take 2.5 mg by mouth as directed. (Patient not taking: Reported on 04/07/2021), Disp: , Rfl:    predniSONE (DELTASONE) 20 MG tablet, Take 2 tablets (40 mg total) by mouth daily. (Patient not taking: Reported on 04/07/2021), Disp: 10 tablet, Rfl: 0   QUEtiapine (SEROQUEL) 300 MG tablet, Take  2 tablets (600 mg total) by mouth at bedtime., Disp: 180 tablet, Rfl: 1   rizatriptan (MAXALT) 10 MG tablet, Take 10 mg by mouth as needed for migraine. May repeat in 2 hours if needed (Patient not taking: No sig reported), Disp: , Rfl:    traZODone (DESYREL) 100 MG tablet, Take 1-2 tablets (100-200 mg total) by mouth at bedtime as needed for sleep., Disp: 180 tablet, Rfl: 1 Medication Side Effects: none  Family Medical/ Social History: Changes? No  MENTAL HEALTH EXAM:  There were no vitals taken for this visit.There is no height or weight on file to calculate BMI.  General Appearance: Casual, Neat, Well Groomed and Obese  Eye Contact:  Good  Speech:  Clear and Coherent and Normal Rate  Volume:  Normal  Mood:  Euthymic  Affect: tearful when talking about her son going to college. Consolable.    Thought Process:  Goal Directed and Descriptions of Associations: Circumstantial  Orientation:  Full (Time, Place, and Person)  Thought Content: Logical   Suicidal Thoughts:  No  Homicidal Thoughts:  No  Memory:  WNL  Judgement:  Good  Insight:  Good  Psychomotor Activity:  Normal  Concentration:  Concentration: Good and Attention Span: Good  Recall:  Good  Fund of Knowledge: Good  Language: Good  Assets:  Desire for Improvement  ADL's:  Intact  Cognition: WNL  Prognosis:  Good   Labs followed by PCP 10/01/2020 glucose was 80.  See other lab results on the chart.  DIAGNOSES:    ICD-10-CM   1. Situational mixed anxiety and depressive disorder  F43.23     2. Posttraumatic stress disorder  F43.10     3. Migraine without status migrainosus, not intractable, unspecified migraine type  G43.909        Receiving Psychotherapy: No    RECOMMENDATIONS:  PDMP reviewed.  Last Xanax filled 03/03/2021.  No red flags. I provided 30 minutes of face to face time during this encounter, including time spent before and after the visit in records review, medical decision making, and charting.  She  is doing well as far as medications go.  She is having situational anxiety and depression right now but states she will feel better it is just the "newness" of him being gone.  I agree but if it gets worse or her symptoms last too long I definitely recommend that she go to counseling. No change in meds. Continue Xanax 1 mg, 1 po tid prn. Continue Seroquel 300 mg, 2 po qhs. Continue Trazodone 100 mg, 1-2 qhs prn sleep.  Return in 6 months.  Melony Overly, PA-C

## 2021-04-30 DIAGNOSIS — Z6829 Body mass index (BMI) 29.0-29.9, adult: Secondary | ICD-10-CM | POA: Diagnosis not present

## 2021-04-30 DIAGNOSIS — R1084 Generalized abdominal pain: Secondary | ICD-10-CM | POA: Diagnosis not present

## 2021-04-30 DIAGNOSIS — K5901 Slow transit constipation: Secondary | ICD-10-CM | POA: Diagnosis not present

## 2021-05-01 ENCOUNTER — Emergency Department (HOSPITAL_COMMUNITY)
Admission: EM | Admit: 2021-05-01 | Discharge: 2021-05-02 | Disposition: A | Discharge status: Home / Self Care | Payer: No Typology Code available for payment source | Attending: Emergency Medicine | Admitting: Emergency Medicine

## 2021-05-01 ENCOUNTER — Other Ambulatory Visit: Payer: Self-pay

## 2021-05-01 DIAGNOSIS — Z91040 Latex allergy status: Secondary | ICD-10-CM | POA: Diagnosis not present

## 2021-05-01 DIAGNOSIS — Z7951 Long term (current) use of inhaled steroids: Secondary | ICD-10-CM | POA: Diagnosis not present

## 2021-05-01 DIAGNOSIS — R1012 Left upper quadrant pain: Secondary | ICD-10-CM | POA: Diagnosis not present

## 2021-05-01 DIAGNOSIS — J45909 Unspecified asthma, uncomplicated: Secondary | ICD-10-CM | POA: Insufficient documentation

## 2021-05-01 DIAGNOSIS — F17210 Nicotine dependence, cigarettes, uncomplicated: Secondary | ICD-10-CM | POA: Insufficient documentation

## 2021-05-01 LAB — URINALYSIS, ROUTINE W REFLEX MICROSCOPIC
Bilirubin Urine: NEGATIVE
Glucose, UA: NEGATIVE mg/dL
Hgb urine dipstick: NEGATIVE
Ketones, ur: NEGATIVE mg/dL
Leukocytes,Ua: NEGATIVE
Nitrite: NEGATIVE
Protein, ur: NEGATIVE mg/dL
Specific Gravity, Urine: 1.016 (ref 1.005–1.030)
pH: 7 (ref 5.0–8.0)

## 2021-05-01 LAB — CBC WITH DIFFERENTIAL/PLATELET
Abs Immature Granulocytes: 0.04 10*3/uL (ref 0.00–0.07)
Basophils Absolute: 0.1 10*3/uL (ref 0.0–0.1)
Basophils Relative: 1 %
Eosinophils Absolute: 0.4 10*3/uL (ref 0.0–0.5)
Eosinophils Relative: 5 %
HCT: 40.8 % (ref 36.0–46.0)
Hemoglobin: 12.7 g/dL (ref 12.0–15.0)
Immature Granulocytes: 0 %
Lymphocytes Relative: 36 %
Lymphs Abs: 3.3 10*3/uL (ref 0.7–4.0)
MCH: 30 pg (ref 26.0–34.0)
MCHC: 31.1 g/dL (ref 30.0–36.0)
MCV: 96.2 fL (ref 80.0–100.0)
Monocytes Absolute: 0.7 10*3/uL (ref 0.1–1.0)
Monocytes Relative: 8 %
Neutro Abs: 4.6 10*3/uL (ref 1.7–7.7)
Neutrophils Relative %: 50 %
Platelets: 278 10*3/uL (ref 150–400)
RBC: 4.24 MIL/uL (ref 3.87–5.11)
RDW: 13.5 % (ref 11.5–15.5)
WBC: 9.1 10*3/uL (ref 4.0–10.5)
nRBC: 0 % (ref 0.0–0.2)

## 2021-05-01 LAB — COMPREHENSIVE METABOLIC PANEL
ALT: 72 U/L — ABNORMAL HIGH (ref 0–44)
AST: 38 U/L (ref 15–41)
Albumin: 4.1 g/dL (ref 3.5–5.0)
Alkaline Phosphatase: 64 U/L (ref 38–126)
Anion gap: 9 (ref 5–15)
BUN: 16 mg/dL (ref 6–20)
CO2: 21 mmol/L — ABNORMAL LOW (ref 22–32)
Calcium: 9.1 mg/dL (ref 8.9–10.3)
Chloride: 107 mmol/L (ref 98–111)
Creatinine, Ser: 0.79 mg/dL (ref 0.44–1.00)
GFR, Estimated: >60 mL/min (ref >60)
Glucose, Bld: 96 mg/dL (ref 70–99)
Potassium: 3.5 mmol/L (ref 3.5–5.1)
Sodium: 137 mmol/L (ref 135–145)
Total Bilirubin: 0.6 mg/dL (ref 0.3–1.2)
Total Protein: 6.8 g/dL (ref 6.5–8.1)

## 2021-05-01 LAB — LIPASE, BLOOD: Lipase: 47 U/L (ref 11–51)

## 2021-05-01 NOTE — ED Triage Notes (Signed)
Pt c/o left, upper abdominal pain since Saturday. Pt reports nausea, denies vomiting.

## 2021-05-01 NOTE — ED Provider Notes (Signed)
Emergency Medicine Provider Triage Evaluation Note  Jill Bruce , a 40 y.o. female  was evaluated in triage.  Pt complains of left upper quadrant tenderness and nausea.  No vomiting.  The pain is been constant at a 3 since Saturday, when the pain worsens can be a 10.  She is status post cholecystectomy and hysterectomy.  Eating food can make the pain worse, nothing alleviates the pain.  She had an EGD last year due to chronic reflux disease and it was negative.  She is on an [facility]if[facility] program with [facility]  Review of Systems  Positive: Left upper quadrant tenderness, nausea Negative: Vomiting, dysuria  Physical Exam  BP 105/84 (BP Location: Right Arm)   Pulse 84   Temp 97.7 F (36.5 C) (Oral)   Resp 18   Ht 5\' 7"  (1.702 m)   Wt 83.9 kg   SpO2 100%   BMI 28.98 kg/m  Gen:   Awake, no distress   Resp:  Normal effort  MSK:   Moves extremities without difficulty Other:  Left upper quadrant/periumbilical abdominal pain.  No rigidity or guarding, abdomen is soft  Medical Decision Making  Medically screening exam initiated at 9:41 PM.  Appropriate orders placed.  Honesty A Mahany was informed that the remainder of the evaluation will be completed by another provider, this initial triage assessment does not replace that evaluation, and the importance of remaining in the ED until their evaluation is complete.  Abdominal lab   , PA-C 05/01/21 2142    2143, DO 05/01/21 2358

## 2021-05-02 ENCOUNTER — Emergency Department (HOSPITAL_COMMUNITY): Payer: No Typology Code available for payment source

## 2021-05-02 MED ORDER — PANTOPRAZOLE SODIUM 40 MG IV SOLR
40.0000 mg | Freq: Once | INTRAVENOUS | Status: AC
  Administered 2021-05-02: 40 mg via INTRAVENOUS
  Filled 2021-05-02: qty 40

## 2021-05-02 MED ORDER — FENTANYL CITRATE PF 50 MCG/ML IJ SOSY
50.0000 ug | PREFILLED_SYRINGE | Freq: Once | INTRAMUSCULAR | Status: AC
  Administered 2021-05-02: 50 ug via INTRAVENOUS
  Filled 2021-05-02: qty 1

## 2021-05-02 MED ORDER — IOHEXOL 300 MG/ML  SOLN
100.0000 mL | Freq: Once | INTRAMUSCULAR | Status: AC | PRN
  Administered 2021-05-02: 100 mL via INTRAVENOUS

## 2021-05-02 NOTE — ED Provider Notes (Signed)
[facility] EMERGENCY DEPARTMENT Provider Note   CSN: [id number] Arrival date & time: 05/01/21  2126     History Chief Complaint  Patient presents with   Abdominal Pain    Jill Bruce is a 40 y.o. female.  The history is provided by the patient.  Abdominal Pain Pain location:  LUQ Pain quality: cramping   Pain radiates to:  Does not radiate Pain severity:  Moderate Onset quality:  Gradual Duration:  5 days Timing:  Intermittent Progression:  Waxing and waning Chronicity:  New Context: not alcohol use   Relieved by:  Nothing Worsened by:  Nothing Associated symptoms: constipation and nausea   Associated symptoms: no chest pain, no dysuria and no vomiting   Risk factors: no alcohol abuse, no NSAID use and not pregnant   Patient with history of depression, fibromyalgia, severe constipation presents with left upper quadrant abdominal pain She reports nausea but no vomiting.    Past Medical History:  Diagnosis Date   Allergy    Anxiety    Arthritis    Asthma    Avascular necrosis (HCC)    Depression    Fibromyalgia    GERD (gastroesophageal reflux disease)    Migraine    Post traumatic stress disorder (PTSD)     Patient Active Problem List   Diagnosis Date Noted   Posttraumatic stress disorder 05/14/2018   Generalized anxiety disorder 05/14/2018   Migraine 07/18/2015   Obesity, Class III, BMI 40-49.9 (morbid obesity) (HCC) 12/07/2013    Past Surgical History:  Procedure Laterality Date   ABDOMINAL HYSTERECTOMY     2012   BLADDER SUSPENSION     2012   CHOLECYSTECTOMY     2009   FOOT SURGERY     2011   JOINT REPLACEMENT     Right Hip and Right knee - both 2015   TONSILLECTOMY     TUBAL LIGATION     2012     OB History     Gravida  3   Para  3   Term  3   Preterm      AB      Living  3      SAB      IAB      Ectopic      Multiple      Live Births              Family History  Problem  Relation Age of Onset   COPD Father    Heart disease Father    Stroke Father    Heart attack Father    Sleep apnea Father    Kidney failure Father    Diabetes Other        Paternal aunt and uncle   Alzheimer's disease Paternal Grandmother     Social History   Tobacco Use   Smoking status: Every Day    Packs/day: 1.00    Types: E-cigarettes, Cigarettes    Last attempt to quit: 05/09/2008    Years since quitting: 12.9   Smokeless tobacco: Never  Vaping Use   Vaping Use: Never used  Substance Use Topics   Alcohol use: Yes    Alcohol/week: 0.0 standard drinks    Comment: once q 2-3 months   Drug use: No    Home Medications Prior to Admission medications   Medication Sig Start Date End Date Taking? Authorizing Provider  albuterol (VENTOLIN HFA) 108 (90 Base) MCG/ACT inhaler Inhale 1-2 puffs into  the lungs every 6 (six) hours as needed for wheezing or shortness of breath. 03/25/21   Achille Rich, PA-C  ALPRAZolam Prudy Feeler) 1 MG tablet Take 1 tablet (1 mg total) by mouth 3 (three) times daily as needed for anxiety. 04/07/21   Melony Overly T, PA-C  budesonide-formoterol (SYMBICORT) 80-4.5 MCG/ACT inhaler Inhale 2 puffs into the lungs 2 (two) times daily.    [provider]  cetirizine (ZYRTEC) 10 MG tablet Take 10 mg by mouth daily.    [provider]  fexofenadine (ALLEGRA) 180 MG tablet Take 180 mg by mouth daily.    [provider]  levETIRAcetam (KEPPRA) 250 MG tablet Take by mouth in the morning, at noon, and at bedtime. 03/17/20   [provider]  LINZESS 145 MCG CAPS capsule Take 145 mcg by mouth every 3 (three) days.  Patient not taking: No sig reported 11/22/18   [provider]  montelukast (SINGULAIR) 10 MG tablet Take 10 mg by mouth at bedtime.    [provider]  naproxen (NAPROSYN) 500 MG tablet Take 1 tablet (500 mg total) by mouth 2 (two) times daily as needed. Patient taking differently: Take 500 mg by mouth 2 (two)  times daily as needed for mild pain. 11/26/16   Judeth Horn, NP  naratriptan (AMERGE) 2.5 MG tablet Take 2.5 mg by mouth as directed. Patient not taking: Reported on 04/07/2021 02/13/20   [provider]  pantoprazole (PROTONIX) 40 MG tablet Take 40 mg by mouth daily.    [provider]  predniSONE (DELTASONE) 20 MG tablet Take 2 tablets (40 mg total) by mouth daily. Patient not taking: Reported on 04/07/2021 03/25/21   Eber Hong, MD  QUEtiapine (SEROQUEL) 300 MG tablet Take 2 tablets (600 mg total) by mouth at bedtime. 04/07/21   Melony Overly T, PA-C  rizatriptan (MAXALT) 10 MG tablet Take 10 mg by mouth as needed for migraine. May repeat in 2 hours if needed Patient not taking: No sig reported    [provider]  topiramate (TOPAMAX) 100 MG tablet Take 100-200 mg by mouth See admin instructions. Take 1 tablet every morning and at lunch then take 2 tablets at bedtime    [provider]  traZODone (DESYREL) 100 MG tablet Take 1-2 tablets (100-200 mg total) by mouth at bedtime as needed for sleep. 04/07/21   Melony Overly T, PA-C  verapamil (CALAN-SR) 120 MG CR tablet Take 120 mg by mouth daily. 10/11/19   [provider]    Allergies    Chantix [varenicline tartrate], Varenicline, Augmentin [amoxicillin-pot clavulanate], Latex, and Tape  Review of Systems   Review of Systems  Cardiovascular:  Negative for chest pain.  Gastrointestinal:  Positive for abdominal pain, constipation and nausea. Negative for vomiting.  Genitourinary:  Negative for dysuria.  All other systems reviewed and are negative.  Physical Exam Updated Vital Signs BP 90/65 (BP Location: Right Arm)   Pulse 75   Temp 98.1 F (36.7 C) (Oral)   Resp 17   Ht 1.702 m (5\' 7" )   Wt 83.9 kg   SpO2 100%   BMI 28.98 kg/m   Physical Exam CONSTITUTIONAL: Well developed/well nourished HEAD: Normocephalic/atraumatic EYES: EOMI/PERRL, no icterus NECK: supple no meningeal  signs SPINE/BACK:entire spine nontender CV: S1/S2 noted, no murmurs/rubs/gallops noted LUNGS: Lungs are clear to auscultation bilaterally, no apparent distress ABDOMEN: soft, mild LUQ tenderness, no rebound or guarding, bowel sounds noted throughout abdomen GU:no cva tenderness NEURO: Pt is awake/alert/appropriate, moves all extremitiesx4.  No facial droop.   EXTREMITIES: pulses normal/equal, full ROM SKIN: warm, color normal PSYCH: no abnormalities of mood noted, alert and oriented to situation  ED Results / Procedures / Treatments   Labs (all labs ordered are listed, but only abnormal results are displayed) Labs Reviewed  COMPREHENSIVE METABOLIC PANEL - Abnormal; Notable for the following components:      Result Value   CO2 21 (*)    ALT 72 (*)    All other components within normal limits  URINALYSIS, ROUTINE W REFLEX MICROSCOPIC - Abnormal; Notable for the following components:   APPearance HAZY (*)    All other components within normal limits  CBC WITH DIFFERENTIAL/PLATELET  LIPASE, BLOOD    EKG None  Radiology CT ABDOMEN PELVIS W CONTRAST  Result Date: 05/02/2021 CLINICAL DATA:  Acute abdominal pain EXAM: CT ABDOMEN AND PELVIS WITH CONTRAST TECHNIQUE: Multidetector CT imaging of the abdomen and pelvis was performed using the standard protocol following bolus administration of intravenous contrast. CONTRAST:  100 mL Omnipaque 300 COMPARISON:  04/13/2015 FINDINGS: Lower chest: No acute abnormality. Hepatobiliary: No focal liver abnormality is seen. Status post cholecystectomy. No biliary dilatation. Pancreas: Unremarkable. No pancreatic ductal dilatation or surrounding inflammatory changes. Spleen: Normal in size without focal abnormality. Adrenals/Urinary Tract: Adrenal glands are within normal limits. Kidneys demonstrate a normal enhancement pattern bilaterally. No renal calculi or urinary tract obstructive changes are seen. Ureters are within normal limits. The bladder is  decompressed. Stomach/Bowel: No obstructive or inflammatory changes of the colon are seen. The appendix is not well visualized. No inflammatory changes to suggest appendicitis are noted. Small bowel and stomach are within normal limits. Vascular/Lymphatic: No significant vascular findings are present. No enlarged abdominal or pelvic lymph nodes. Reproductive: Status post hysterectomy. No adnexal masses. Other: No abdominal wall hernia or abnormality. No abdominopelvic ascites. 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 No acute bony abnormality is seen. IMPRESSION: The appendix is not well visualized although no inflammatory changes to suggest appendicitis are seen. Postsurgical changes without acute abnormality. Electronically Signed   By: Alcide Clever M.D.   On: 05/02/2021 03:03    Procedures Procedures   Medications Ordered in ED Medications  fentaNYL (SUBLIMAZE) injection 50 mcg (50 mcg Intravenous Given 05/02/21 0205)  pantoprazole (PROTONIX) injection 40 mg (40 mg Intravenous Given 05/02/21 0204)  iohexol (OMNIPAQUE) 300 MG/ML solution 100 mL (100 mLs Intravenous Contrast Given 05/02/21 0259)    ED Course  I have reviewed the triage vital signs and the nursing notes.  Pertinent labs & imaging results that were available during my care of the patient were reviewed by me and considered in my medical decision making (see chart for details).    MDM Rules/Calculators/A&P                           Patient presents with left upper quadrant abdominal Patient reports long history of significant constipation and reportedly is  having a total colectomy next month for this.  She reports she took a bowel regimen yesterday which did allow her to move her bowels She has had previous cholecystectomy and hysterectomy Patient with focal left upper quadrant tenderness, will proceed with CT imaging 3:43 AM CT imaging is negative.  Patient resting comfortably. No vomiting.  She will be  discharged home. This patient presents to the ED for concern of abdominal pain, this involves an extensive number of treatment options, and is a complaint that carries with it a high  risk of complications and morbidity.  The differential diagnosis includes bowel perforation, bowel obstruction, kidney stone, pancreatitis   Lab Tests:  I Ordered, reviewed, and interpreted labs, which included urinalysis, electrolytes, complete blood count, lipase  Medicines ordered:  I ordered medication fentanyl and Protonix for pain  Imaging Studies ordered:  I ordered imaging studies which included CT abdomen pelvis  I independently visualized and interpreted imaging which showed no acute findings  Additional history obtained:   Previous records obtained and reviewed    Reevaluation:  After the interventions stated above, I reevaluated the patient and found patient is improved  Final Clinical Impression(s) / ED Diagnoses Final diagnoses:  Left upper quadrant abdominal pain    Rx / DC Orders ED Discharge Orders     None        Zadie Rhine, MD 05/02/21 [phone]

## 2021-05-13 HISTORY — PX: COLON SURGERY: SHX602

## 2021-07-09 ENCOUNTER — Other Ambulatory Visit: Payer: Self-pay

## 2021-07-09 ENCOUNTER — Telehealth: Payer: Self-pay | Admitting: Physician Assistant

## 2021-07-09 MED ORDER — QUETIAPINE FUMARATE 300 MG PO TABS: 600.0000 mg | ORAL_TABLET | Freq: Every day | ORAL | 0 refills | Status: DC

## 2021-07-09 NOTE — Telephone Encounter (Signed)
Pt is waiting for mail order of seroquel to arrive. She needs to have five days worth sent into the the [facility] on [address]

## 2021-07-09 NOTE — Telephone Encounter (Signed)
Rx sent 

## 2021-07-09 NOTE — Telephone Encounter (Signed)
Ok to send

## 2021-07-09 NOTE — Telephone Encounter (Signed)
Yes, thanks

## 2021-07-25 ENCOUNTER — Other Ambulatory Visit: Payer: Self-pay

## 2021-07-25 ENCOUNTER — Encounter (HOSPITAL_COMMUNITY): Payer: Self-pay | Admitting: *Deleted

## 2021-07-25 ENCOUNTER — Emergency Department (HOSPITAL_COMMUNITY): Payer: No Typology Code available for payment source

## 2021-07-25 ENCOUNTER — Emergency Department (HOSPITAL_COMMUNITY)
Admission: EM | Admit: 2021-07-25 | Discharge: 2021-07-25 | Disposition: A | Discharge status: Home / Self Care | Payer: No Typology Code available for payment source | Attending: Emergency Medicine | Admitting: Emergency Medicine

## 2021-07-25 DIAGNOSIS — R55 Syncope and collapse: Secondary | ICD-10-CM | POA: Diagnosis not present

## 2021-07-25 DIAGNOSIS — E860 Dehydration: Secondary | ICD-10-CM | POA: Insufficient documentation

## 2021-07-25 DIAGNOSIS — R42 Dizziness and giddiness: Secondary | ICD-10-CM | POA: Diagnosis present

## 2021-07-25 DIAGNOSIS — Z91040 Latex allergy status: Secondary | ICD-10-CM | POA: Diagnosis not present

## 2021-07-25 LAB — COMPREHENSIVE METABOLIC PANEL
ALT: 39 U/L (ref 0–44)
AST: 33 U/L (ref 15–41)
Albumin: 3.7 g/dL (ref 3.5–5.0)
Alkaline Phosphatase: 53 U/L (ref 38–126)
Anion gap: 8 (ref 5–15)
BUN: 14 mg/dL (ref 6–20)
CO2: 19 mmol/L — ABNORMAL LOW (ref 22–32)
Calcium: 8.9 mg/dL (ref 8.9–10.3)
Chloride: 110 mmol/L (ref 98–111)
Creatinine, Ser: 0.75 mg/dL (ref 0.44–1.00)
GFR, Estimated: >60 mL/min (ref >60)
Glucose, Bld: 91 mg/dL (ref 70–99)
Potassium: 4.1 mmol/L (ref 3.5–5.1)
Sodium: 137 mmol/L (ref 135–145)
Total Bilirubin: 0.5 mg/dL (ref 0.3–1.2)
Total Protein: 6.3 g/dL — ABNORMAL LOW (ref 6.5–8.1)

## 2021-07-25 LAB — TROPONIN I (HIGH SENSITIVITY)
Troponin I (High Sensitivity): <2 ng/L (ref <18)
Troponin I (High Sensitivity): <2 ng/L (ref <18)

## 2021-07-25 LAB — CBC WITH DIFFERENTIAL/PLATELET
Abs Immature Granulocytes: 0.02 10*3/uL (ref 0.00–0.07)
Basophils Absolute: 0.1 10*3/uL (ref 0.0–0.1)
Basophils Relative: 1 %
Eosinophils Absolute: 0.3 10*3/uL (ref 0.0–0.5)
Eosinophils Relative: 4 %
HCT: 37.2 % (ref 36.0–46.0)
Hemoglobin: 12.2 g/dL (ref 12.0–15.0)
Immature Granulocytes: 0 %
Lymphocytes Relative: 22 %
Lymphs Abs: 1.9 10*3/uL (ref 0.7–4.0)
MCH: 30.5 pg (ref 26.0–34.0)
MCHC: 32.8 g/dL (ref 30.0–36.0)
MCV: 93 fL (ref 80.0–100.0)
Monocytes Absolute: 0.9 10*3/uL (ref 0.1–1.0)
Monocytes Relative: 10 %
Neutro Abs: 5.5 10*3/uL (ref 1.7–7.7)
Neutrophils Relative %: 63 %
Platelets: 222 10*3/uL (ref 150–400)
RBC: 4 MIL/uL (ref 3.87–5.11)
RDW: 12.7 % (ref 11.5–15.5)
WBC: 8.6 10*3/uL (ref 4.0–10.5)
nRBC: 0 % (ref 0.0–0.2)

## 2021-07-25 LAB — MAGNESIUM: Magnesium: 2 mg/dL (ref 1.7–2.4)

## 2021-07-25 MED ORDER — LACTATED RINGERS IV BOLUS
1000.0000 mL | Freq: Once | INTRAVENOUS | Status: AC
  Administered 2021-07-25: 1000 mL via INTRAVENOUS

## 2021-07-25 NOTE — Discharge Instructions (Addendum)
Your symptoms are likely related to your decreased oral intake since you started calorie restricting.  I recommend you stop your calorie restriction and discuss your symptoms with your surgeon.  You may also discuss stopping your verapamil with your neurologist as this medication may be having the unintended side effect of dropping your blood pressure and causing your symptoms.

## 2021-07-25 NOTE — ED Provider Triage Note (Signed)
Emergency Medicine Provider Triage Evaluation Note  Jill Bruce , a 41 y.o. female  was evaluated in triage.  Pt complains of lightheadedness with positional change, associated with visual change at last about 30 seconds.  She also endorses chest pressure which has been constant since symptom onset.  Patient reports about 2 months ago she had total colectomy and on Wednesday she resumed her presurgical diet including shakes.  She denies fever, chills, abdominal pain or other complaints.  Review of Systems  Positive: As above Negative: As above  Physical Exam  BP 99/70 (BP Location: Right Arm)    Pulse 99    Temp 98 F (36.7 C) (Oral)    Resp 18    SpO2 97%  Gen:   Awake, no distress   Resp:  Normal effort  MSK:   Moves extremities without difficulty  Other:    Medical Decision Making  Medically screening exam initiated at 10:50 AM.  Appropriate orders placed.  Jill Bruce was informed that the remainder of the evaluation will be completed by another provider, this initial triage assessment does not replace that evaluation, and the importance of remaining in the ED until their evaluation is complete.     Evlyn Courier, PA-C 07/25/21 1051

## 2021-07-25 NOTE — ED Provider Notes (Signed)
[facility] EMERGENCY DEPARTMENT Provider Note   CSN: [id number] Arrival date & time: 07/25/21  3785     History  Chief Complaint  Patient presents with   Dizziness   Near Syncope    Jill Bruce is a 41 y.o. female with past medical history significant for slow transit constipation s/p total colectomy who presents with lightheadedness and near syncopal spells.  The patient states that she followed up with her GI surgeon on Tuesday and started a calorie restricted diet on Wednesday.  Since Wednesday, the patient has had lightheadedness and blurry vision followed by near syncope whenever she stands up.  She says that she is able to steady herself and her symptoms eventually go away after she has been standing for a few seconds.  She has not had any syncopal episodes or fallen.  She has had no issues with near syncope since the surgery up until this week.  She had been eating what ever she wanted up until Wednesday.  She denies recent infectious symptoms such as fevers, chills, cough, congestion, sore throat, nausea, vomiting, urinary symptoms, or changes to bowel movements.     Home Medications Prior to Admission medications   Medication Sig Start Date End Date Taking? Authorizing Provider  albuterol (VENTOLIN HFA) 108 (90 Base) MCG/ACT inhaler Inhale 1-2 puffs into the lungs every 6 (six) hours as needed for wheezing or shortness of breath. 03/25/21   Achille Rich, PA-C  ALPRAZolam Prudy Feeler) 1 MG tablet Take 1 tablet (1 mg total) by mouth 3 (three) times daily as needed for anxiety. 04/07/21   Melony Overly T, PA-C  budesonide-formoterol (SYMBICORT) 80-4.5 MCG/ACT inhaler Inhale 2 puffs into the lungs 2 (two) times daily.    [provider]  cetirizine (ZYRTEC) 10 MG tablet Take 10 mg by mouth daily.    [provider]  fexofenadine (ALLEGRA) 180 MG tablet Take 180 mg by mouth daily.    [provider]  levETIRAcetam (KEPPRA) 250 MG  tablet Take by mouth in the morning, at noon, and at bedtime. 03/17/20   [provider]  LINZESS 145 MCG CAPS capsule Take 145 mcg by mouth every 3 (three) days.  Patient not taking: No sig reported 11/22/18   [provider]  montelukast (SINGULAIR) 10 MG tablet Take 10 mg by mouth at bedtime.    [provider]  naproxen (NAPROSYN) 500 MG tablet Take 1 tablet (500 mg total) by mouth 2 (two) times daily as needed. Patient taking differently: Take 500 mg by mouth 2 (two) times daily as needed for mild pain. 11/26/16   Judeth Horn, NP  naratriptan (AMERGE) 2.5 MG tablet Take 2.5 mg by mouth as directed. Patient not taking: Reported on 04/07/2021 02/13/20   [provider]  pantoprazole (PROTONIX) 40 MG tablet Take 40 mg by mouth daily.    [provider]  predniSONE (DELTASONE) 20 MG tablet Take 2 tablets (40 mg total) by mouth daily. Patient not taking: Reported on 04/07/2021 03/25/21   Eber Hong, MD  QUEtiapine (SEROQUEL) 300 MG tablet Take 2 tablets (600 mg total) by mouth at bedtime. 07/09/21   Melony Overly T, PA-C  rizatriptan (MAXALT) 10 MG tablet Take 10 mg by mouth as needed for migraine. May repeat in 2 hours if needed Patient not taking: No sig reported    [provider]  topiramate (TOPAMAX) 100 MG tablet Take 100-200 mg by mouth See admin instructions. Take 1 tablet every morning and at lunch  then take 2 tablets at bedtime    [provider]  traZODone (DESYREL) 100 MG tablet Take 1-2 tablets (100-200 mg total) by mouth at bedtime as needed for sleep. 04/07/21   Melony OverlyHurst, Teresa T, PA-C  verapamil (CALAN-SR) 120 MG CR tablet Take 120 mg by mouth daily. 10/11/19   [provider]      Allergies    Chantix [varenicline tartrate], Varenicline, Augmentin [amoxicillin-pot clavulanate], Latex, and Tape    Review of Systems   Review of Systems  Constitutional:  Negative for chills and fever.  HENT:  Negative for  congestion.   Respiratory:  Negative for cough and shortness of breath.   Cardiovascular:  Positive for near-syncope. Negative for chest pain and palpitations.  Gastrointestinal:  Negative for abdominal pain, nausea and vomiting.  Genitourinary:  Negative for dysuria.  Neurological:  Positive for dizziness and light-headedness. Negative for syncope, weakness, numbness and headaches.   Physical Exam Updated Vital Signs BP 103/68    Pulse 72    Temp 98 F (36.7 C) (Oral)    Resp 18    SpO2 100%  Physical Exam Vitals and nursing note reviewed.  Constitutional:      General: She is not in acute distress.    Appearance: She is well-developed.  HENT:     Head: Normocephalic and atraumatic.     Right Ear: External ear normal.     Left Ear: External ear normal.     Nose: Nose normal.     Mouth/Throat:     Mouth: Mucous membranes are dry.     Pharynx: Oropharynx is clear.  Eyes:     Extraocular Movements: Extraocular movements intact.     Conjunctiva/sclera: Conjunctivae normal.     Pupils: Pupils are equal, round, and reactive to light.  Cardiovascular:     Rate and Rhythm: Normal rate and regular rhythm.     Heart sounds: No murmur heard. Pulmonary:     Effort: Pulmonary effort is normal. No respiratory distress.     Breath sounds: Normal breath sounds.  Abdominal:     Palpations: Abdomen is soft.     Tenderness: There is no abdominal tenderness. There is no guarding or rebound.     Comments: Healing abdominal surgical wound covered with abd pad. No signs of infection.  Musculoskeletal:        General: No swelling.     Cervical back: Neck supple.  Skin:    General: Skin is warm and dry.     Capillary Refill: Capillary refill takes less than 2 seconds.  Neurological:     General: No focal deficit present.     Mental Status: She is alert and oriented to person, place, and time.  Psychiatric:        Mood and Affect: Mood normal.    ED Results / Procedures / Treatments    Labs (all labs ordered are listed, but only abnormal results are displayed) Labs Reviewed  COMPREHENSIVE METABOLIC PANEL - Abnormal; Notable for the following components:      Result Value   CO2 19 (*)    Total Protein 6.3 (*)    All other components within normal limits  CBC WITH DIFFERENTIAL/PLATELET  MAGNESIUM  TROPONIN I (HIGH SENSITIVITY)  TROPONIN I (HIGH SENSITIVITY)    EKG EKG Interpretation  Date/Time:  Friday July 25 2021 10:01:13 EST Ventricular Rate:  108 PR Interval:  104 QRS Duration: 74 QT Interval:  484 QTC Calculation: 648 R Axis:   80  Text Interpretation: ** Critical Test Result: Long QTc Sinus tachycardia with short PR Nonspecific T wave abnormality Prolonged QT Abnormal ECG When compared with ECG of 25-Mar-2021 09:47, PREVIOUS ECG IS PRESENT When compared to prior,  longer QTc. NO STEMI Confirmed by Theda Belfast ([id number]) on 07/25/2021 6:18:21 PM  Radiology DG Chest 2 View  Result Date: 07/25/2021 CLINICAL DATA:  Chest pain EXAM: CHEST - 2 VIEW COMPARISON:  03/25/2021 FINDINGS: Cardiac and mediastinal contours are within normal limits. No focal pulmonary opacity. No pleural effusion or pneumothorax. No acute osseous abnormality. IMPRESSION: No acute cardiopulmonary process. Electronically Signed   By: Wiliam Ke M.D.   On: 07/25/2021 11:08    Procedures Procedures   Medications Ordered in ED Medications  lactated ringers bolus 1,000 mL (0 mLs Intravenous Stopped 07/25/21 2017)    ED Course/ Medical Decision Making/ A&P                            Patient presents with near syncope as described in HPI above.  The symptoms started after the patient began calorie restricting herself on Wednesday.  I suspect that decreased p.o. intake as well as patient's verapamil for migraines may be contributing to orthostatic hypotension.  EKG shows sinus tachycardia with prolonged QT and nonspecific T wave abnormalities.  QT is longer compared to prior EKG, but  otherwise there are no significant changes.  There is no evidence of significant elevations or depressions to suggest STEMI.  Patient denies chest pain or palpitations and I do not believe her symptoms are cardiac in etiology.  Labs and imaging reviewed by myself.  CXR with no acute cardiopulmonary process.  CBC and metabolic panel without significant abnormality.  No AKI or electrolyte abnormalities.  Magnesium is normal.  Troponin is normal x2.  The patient was given a liter LR in the emergency department with improvement in her symptoms.   I discussed my recommendations with both the patient and her husband at bedside. I encouraged her to discuss her verapamil prescription with her neurologist to see if there is another medication that can be used to treat her migraines as this may be contributing to her orthostatic hypotension.  I also instructed her to not calorie restrict her self as long as this is okay with her gastric surgeon.  Discharge instructions and return precautions were discussed with the patient prior to discharge including the AVS.  The patient voiced understanding of these instructions and was amenable with the plan as described.  The patient was then discharged in stable condition.  Final Clinical Impression(s) / ED Diagnoses Final diagnoses:  Near syncope  Dehydration    Rx / DC Orders ED Discharge Orders     None         Arlette Schaad, Davy Pique, MD 07/26/21 1248    Tegeler, Canary Brim, MD 07/27/21 1046

## 2021-07-25 NOTE — ED Notes (Signed)
Pt tolerated orthostatic vitals well no complaints noted.

## 2021-07-25 NOTE — ED Triage Notes (Signed)
Pt and family reports surgery two months ago to remove large intestine. Having dizziness and weakness when standing that has gotten progressively worse, she gets blurred vision and become near syncopal.

## 2021-09-08 ENCOUNTER — Other Ambulatory Visit: Payer: Self-pay | Admitting: Physician Assistant

## 2021-09-15 ENCOUNTER — Other Ambulatory Visit: Payer: Self-pay | Admitting: Physician Assistant

## 2021-09-16 ENCOUNTER — Other Ambulatory Visit: Payer: Self-pay | Admitting: Physician Assistant

## 2021-10-06 ENCOUNTER — Encounter: Payer: Self-pay | Admitting: Physician Assistant

## 2021-10-06 ENCOUNTER — Other Ambulatory Visit: Payer: Self-pay

## 2021-10-06 ENCOUNTER — Ambulatory Visit (INDEPENDENT_AMBULATORY_CARE_PROVIDER_SITE_OTHER): Payer: Medicare PPO | Admitting: Physician Assistant

## 2021-10-06 DIAGNOSIS — F3341 Major depressive disorder, recurrent, in partial remission: Secondary | ICD-10-CM

## 2021-10-06 DIAGNOSIS — F4312 Post-traumatic stress disorder, chronic: Secondary | ICD-10-CM

## 2021-10-06 DIAGNOSIS — F515 Nightmare disorder: Secondary | ICD-10-CM | POA: Diagnosis not present

## 2021-10-06 DIAGNOSIS — G478 Other sleep disorders: Secondary | ICD-10-CM

## 2021-10-06 DIAGNOSIS — F411 Generalized anxiety disorder: Secondary | ICD-10-CM

## 2021-10-06 DIAGNOSIS — F4310 Post-traumatic stress disorder, unspecified: Secondary | ICD-10-CM | POA: Diagnosis not present

## 2021-10-06 MED ORDER — QUETIAPINE FUMARATE 400 MG PO TABS: 800.0000 mg | ORAL_TABLET | Freq: Every day | ORAL | 1 refills | Status: DC

## 2021-10-06 NOTE — Progress Notes (Signed)
[facility] ? ?Patient ID: Barkley Bruns,  ?MRN: [id number] ? ?PCP: [facility] ? ?Date of Evaluation:10/06/2021 ?Time spent:30 minutes ? ?Chief Complaint:  ?Chief Complaint   ?Anxiety; Depression; Insomnia; Follow-up ?  ? ? ?HISTORY/CURRENT STATUS: ?For 31-month med check. ? ?Still very tired after surgery in Nov. See ROS/PMH. Has been told it's common after a colectomy.  Also since the surgery she has been experiencing sleep paralysis more.  That originally happened to her when she had an orthopedic surgery several years ago and was not asleep while they were doing the surgery.  Of course she had PTSD from that.  She had been doing very well up until this surgery.  States she was taken to the OR and by the time that she got in there the Versed had worn off so she does remember being taken in there, hearing the staff talking and her getting over on the operating table.  She is not sure if that is a part of the problem with the sleep paralysis and also some nightmares.  These occur 1-2 times per week. ? ?Patient denies loss of interest in usual activities and is able to enjoy things.  Denies decreased energy or motivation.  Appetite has not changed.  No extreme sadness, tearfulness, or feelings of hopelessness.  Denies any changes in concentration, making decisions or remembering things.  Anxiety is controlled.  Denies suicidal or homicidal thoughts. ? ?Patient denies increased energy with decreased need for sleep, no increased talkativeness, no racing thoughts, no impulsivity or risky behaviors, no increased spending, no increased libido, no grandiosity, no increased irritability or anger, and no hallucinations.  ? ?Denies dizziness, syncope, seizures, numbness, tingling, tremor, tics, unsteady gait, slurred speech, confusion. Denies muscle or joint pain, stiffness, or dystonia. ? ?Individual Medical History/ Review of Systems: Changes? :Yes    surgery-total colectomy, had ileus w/  pneumoperitoneum, prolonged hospital stay. Still very tired.  ?Had dehydration last week, went to ER and had IVF.  ? ?Past medications for mental health diagnoses include: ?Trintellix, Remeron, Xanax, Prazosin, Seroquel, Prozac, Wellbutrin, Contrave, Lunesta ? ?Allergies: Chantix [varenicline tartrate], Varenicline, Augmentin [amoxicillin-pot clavulanate], Latex, and Tape ? ?Current Medications:  ?Current Outpatient Medications:  ?  albuterol (VENTOLIN HFA) 108 (90 Base) MCG/ACT inhaler, Inhale 1-2 puffs into the lungs every 6 (six) hours as needed for wheezing or shortness of breath., Disp: 1 each, Rfl: 0 ?  ALPRAZolam (XANAX) 1 MG tablet, TAKE 1 TABLET BY MOUTH 3 TIMES DAILY AS NEEDED FOR ANXIETY., Disp: 90 tablet, Rfl: 1 ?  budesonide-formoterol (SYMBICORT) 80-4.5 MCG/ACT inhaler, Inhale 2 puffs into the lungs 2 (two) times daily., Disp: , Rfl:  ?  cetirizine (ZYRTEC) 10 MG tablet, Take 10 mg by mouth daily., Disp: , Rfl:  ?  fexofenadine (ALLEGRA) 180 MG tablet, Take 180 mg by mouth daily., Disp: , Rfl:  ?  levETIRAcetam (KEPPRA) 250 MG tablet, Take by mouth in the morning, at noon, and at bedtime., Disp: , Rfl:  ?  montelukast (SINGULAIR) 10 MG tablet, Take 10 mg by mouth at bedtime., Disp: , Rfl:  ?  naproxen (NAPROSYN) 500 MG tablet, Take 1 tablet (500 mg total) by mouth 2 (two) times daily as needed. (Patient taking differently: Take 500 mg by mouth 2 (two) times daily as needed for mild pain.), Disp: 30 tablet, Rfl: 0 ?  pantoprazole (PROTONIX) 40 MG tablet, Take 40 mg by mouth daily., Disp: , Rfl:  ?  QUEtiapine (SEROQUEL) 400 MG tablet, Take  2 tablets (800 mg total) by mouth at bedtime., Disp: 180 tablet, Rfl: 1 ?  topiramate (TOPAMAX) 100 MG tablet, Take 100-200 mg by mouth See admin instructions. Take 1 tablet every morning and at lunch then take 2 tablets at bedtime, Disp: , Rfl:  ?  traZODone (DESYREL) 100 MG tablet, TAKE 1 TO 2 TABLETS AT BEDTIME AS NEEDED FOR SLEEP, Disp: 180 tablet, Rfl: 0 ?   LINZESS 145 MCG CAPS capsule, Take 145 mcg by mouth every 3 (three) days.  (Patient not taking: Reported on 04/12/2020), Disp: , Rfl:  ?  naratriptan (AMERGE) 2.5 MG tablet, Take 2.5 mg by mouth as directed. (Patient not taking: Reported on 04/07/2021), Disp: , Rfl:  ?  predniSONE (DELTASONE) 20 MG tablet, Take 2 tablets (40 mg total) by mouth daily. (Patient not taking: Reported on 04/07/2021), Disp: 10 tablet, Rfl: 0 ?  rizatriptan (MAXALT) 10 MG tablet, Take 10 mg by mouth as needed for migraine. May repeat in 2 hours if needed (Patient not taking: No sig reported), Disp: , Rfl:  ?  verapamil (CALAN-SR) 120 MG CR tablet, Take 120 mg by mouth daily. (Patient not taking: Reported on 10/06/2021), Disp: , Rfl:  ?Medication Side Effects: none ? ?Family Medical/ Social History: Changes? No ? ?MENTAL HEALTH EXAM: ? ?There were no vitals taken for this visit.There is no height or weight on file to calculate BMI.  ?General Appearance: Casual, Neat, Well Groomed and Obese  ?Eye Contact:  Good  ?Speech:  Clear and Coherent and Normal Rate  ?Volume:  Normal  ?Mood:  Euthymic  ?Affect: nl    ?Thought Process:  Goal Directed and Descriptions of Associations: Circumstantial  ?Orientation:  Full (Time, Place, and Person)  ?Thought Content: Logical   ?Suicidal Thoughts:  No  ?Homicidal Thoughts:  No  ?Memory:  WNL  ?Judgement:  Good  ?Insight:  Good  ?Psychomotor Activity:  Normal  ?Concentration:  Concentration: Good and Attention Span: Good  ?Recall:  Good  ?Fund of Knowledge: Good  ?Language: Good  ?Assets:  Desire for Improvement  ?ADL's:  Intact  ?Cognition: WNL  ?Prognosis:  Good  ? ?Labs followed by PCP. ?Briefly reviewed labs from November 2022 inpatient. ? ?DIAGNOSES:  ?  ICD-10-CM   ?1. Posttraumatic stress disorder  F43.10   ?  ?2. Generalized anxiety disorder  F41.1   ?  ?3. Recurrent major depressive disorder, in partial remission ([place])  F33.41   ?  ?4. 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 F43.12   ?  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  ? ? ?Receiving Psychotherapy: No  ? ? ?RECOMMENDATIONS:  ?PDMP reviewed.  Last Xanax filled 09/09/2021. ?I provided 30 minutes of face to face time during this encounter, including time spent before and after the visit in records review, medical decision making, counseling pertinent to today's visit, and charting.  ?Discussed sleep paralysis.  I think that it is somehow related to the PTSD of the recent surgery.  Recommend increasing the Seroquel to max dose.  It should also help with nightmares.  Unfortunately prazosin or doxazosin are not options because of hypotension that she has had recently and had to stop the verapamil which she was on for migraines.  She does have an appointment with her neurologist in July and if needed I can consult with them concerning the sleep paralysis. ? ?Continue Xanax 1 mg, 1 po tid prn. ?Increase Seroquel to 400 mg, 2 po  qhs.  (She will finish off the 300 mg she has first by taking 2.5 pills equals 750 mg) ?Continue Trazodone 100 mg, 1-2 qhs prn sleep.  ?Return in 6 weeks. ? ?Donnal Moat, PA-C  ?

## 2021-11-17 ENCOUNTER — Telehealth (INDEPENDENT_AMBULATORY_CARE_PROVIDER_SITE_OTHER): Payer: Medicare PPO | Admitting: Physician Assistant

## 2021-11-17 ENCOUNTER — Encounter: Payer: Self-pay | Admitting: Physician Assistant

## 2021-11-17 DIAGNOSIS — F3341 Major depressive disorder, recurrent, in partial remission: Secondary | ICD-10-CM | POA: Diagnosis not present

## 2021-11-17 DIAGNOSIS — F4310 Post-traumatic stress disorder, unspecified: Secondary | ICD-10-CM

## 2021-11-17 DIAGNOSIS — G4700 Insomnia, unspecified: Secondary | ICD-10-CM | POA: Diagnosis not present

## 2021-11-17 DIAGNOSIS — F411 Generalized anxiety disorder: Secondary | ICD-10-CM | POA: Diagnosis not present

## 2021-11-17 MED ORDER — HYDROXYZINE HCL 10 MG PO TABS: 10.0000 mg | ORAL_TABLET | Freq: Three times a day (TID) | ORAL | 0 refills | Status: DC | PRN

## 2021-11-17 MED ORDER — TRAZODONE HCL 100 MG PO TABS: ORAL_TABLET | ORAL | 3 refills | Status: DC

## 2021-11-17 MED ORDER — ALPRAZOLAM 1 MG PO TABS: 1.0000 mg | ORAL_TABLET | Freq: Three times a day (TID) | ORAL | 5 refills | Status: DC | PRN
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
## 2022-02-17 IMAGING — CT CT ABD-PELV W/ CM
2 of 4 series · 16 of 46 positions shown, 18 images · IV contrast (APPLIED)
Comparison: 04/13/2015

CLINICAL DATA: Acute abdominal pain

EXAM:
CT ABDOMEN AND PELVIS WITH CONTRAST
TECHNIQUE: Multidetector CT imaging of the abdomen and pelvis was performed
using the standard protocol following bolus administration of
intravenous contrast.
CONTRAST:  100 mL Omnipaque 300

[Series 3: abdomen 5.0 · axial · 0.98mm/px · z∈[+858,+1348]mm · 13 of 110 slices shown, 15 images]
[im 6/110  soft-tissue]
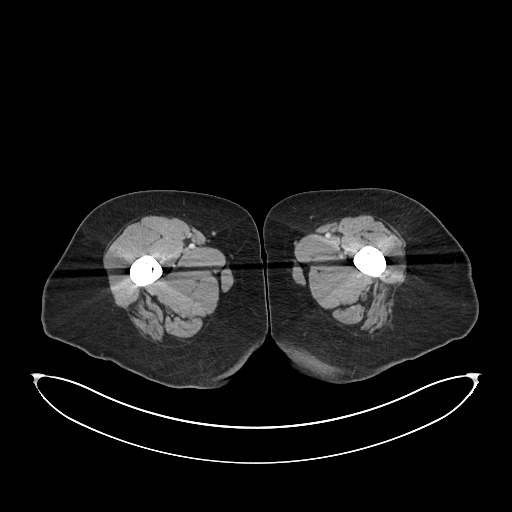
[im 6/110  bone]
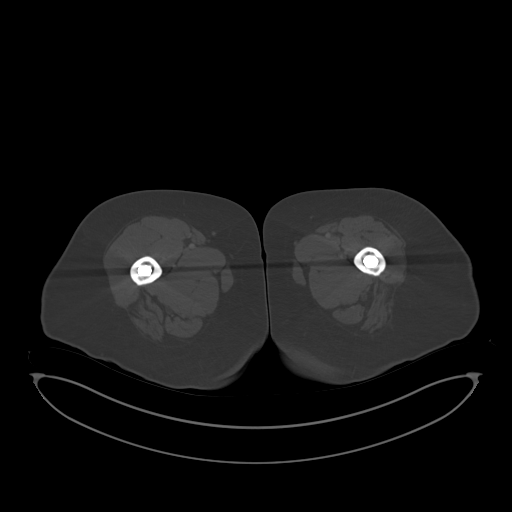
[im 18/110  soft-tissue]
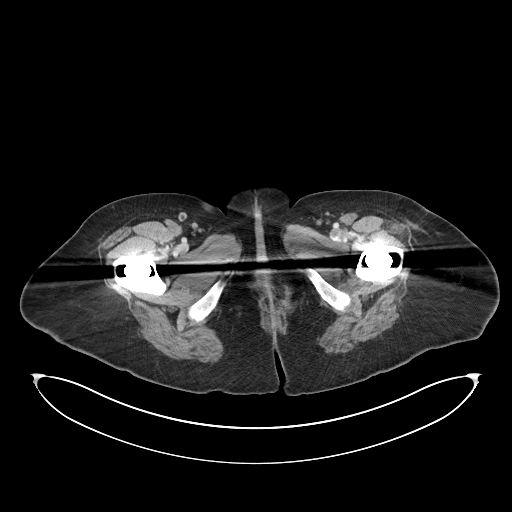
[im 23/110  soft-tissue]
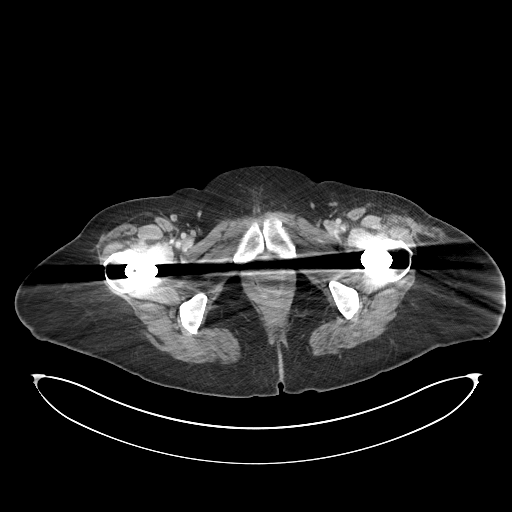
[im 29/110  soft-tissue]
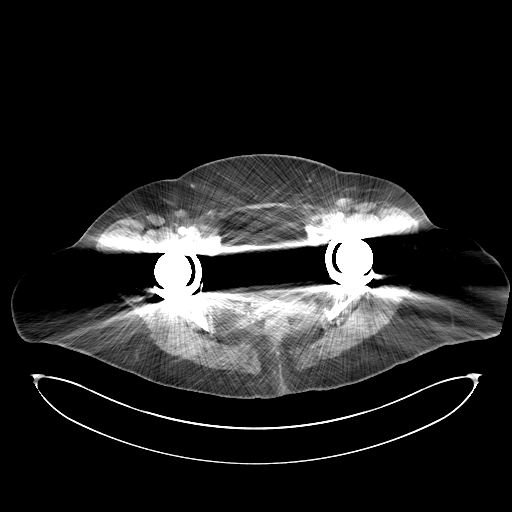
[im 41/110  soft-tissue]
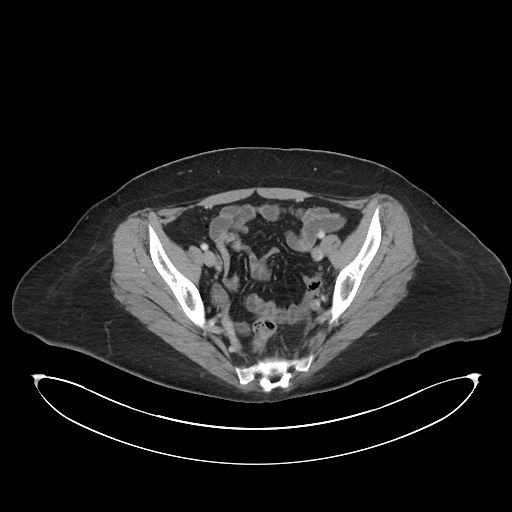
[im 46/110  soft-tissue]
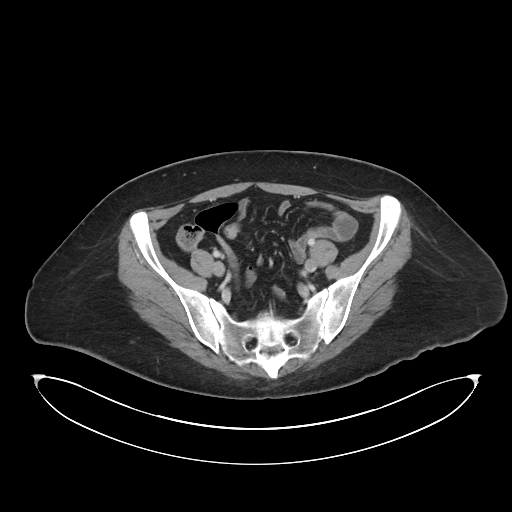
[im 58/110  soft-tissue]
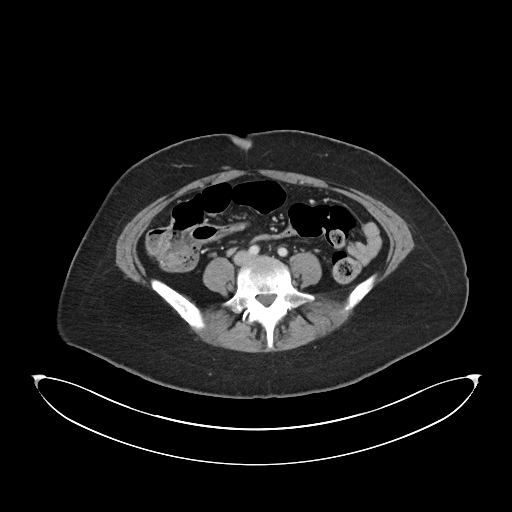
[im 64/110  soft-tissue]
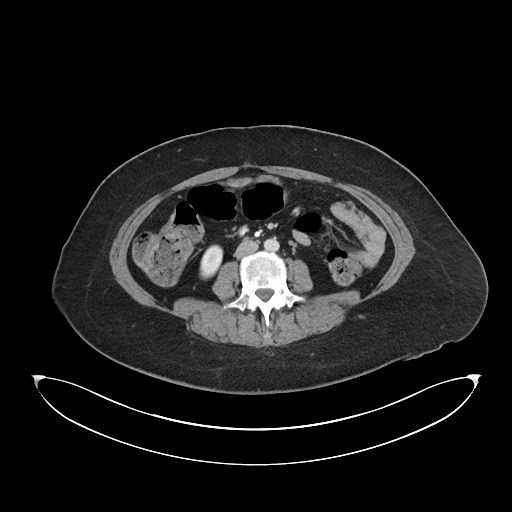
[im 69/110  soft-tissue]
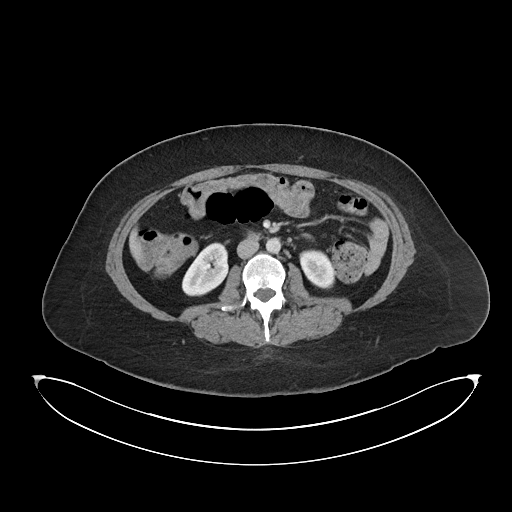
[im 69/110  bone]
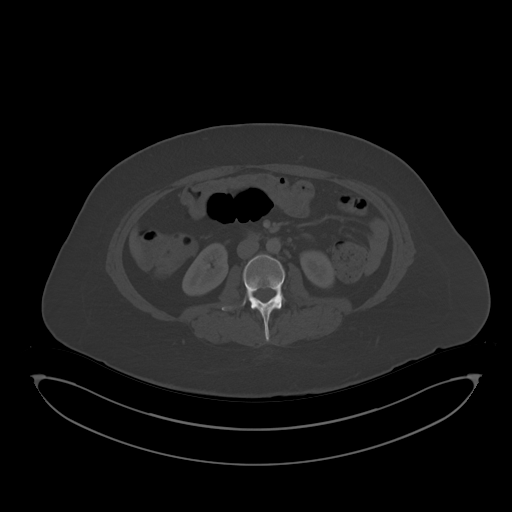
[im 81/110  soft-tissue]
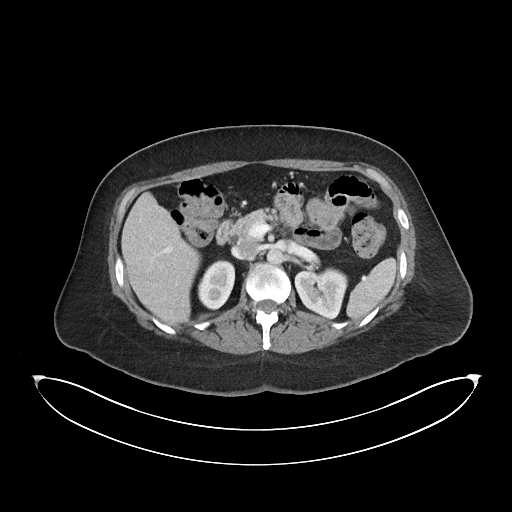
[im 87/110  soft-tissue]
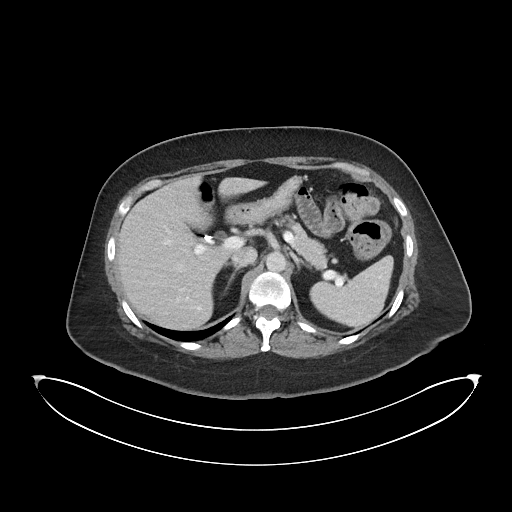
[im 92/110  soft-tissue]
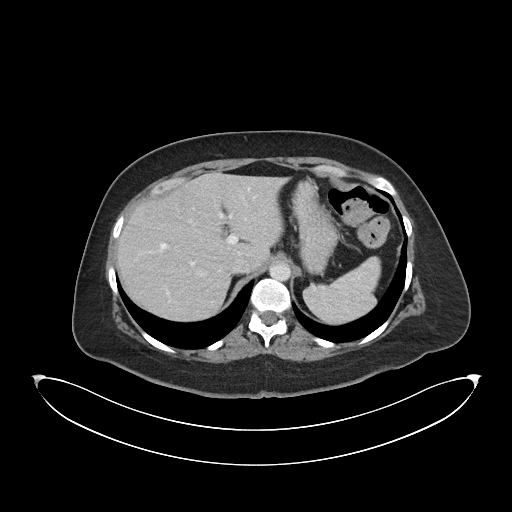
[im 104/110  soft-tissue]
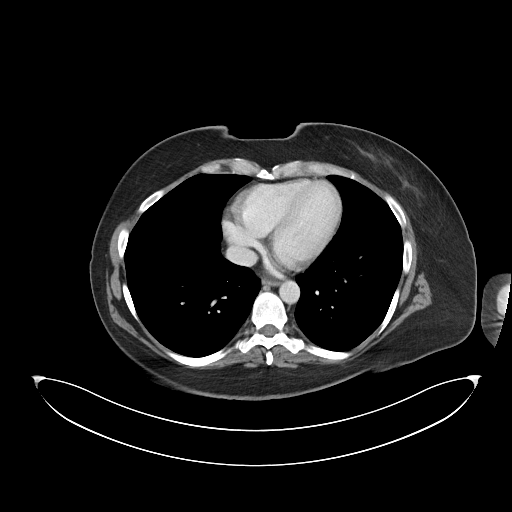

[Series 6: abdomen 3.0 mpr cor · coronal · 0.87mm/px · 3 of 94 slices shown]
[im 32/94  soft-tissue]
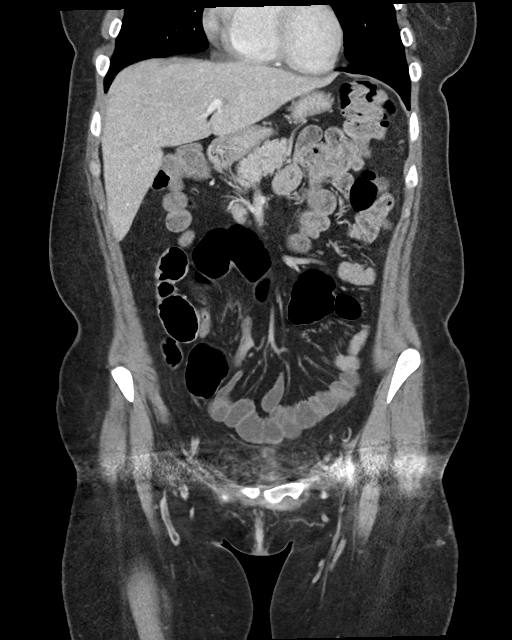
[im 42/94  soft-tissue]
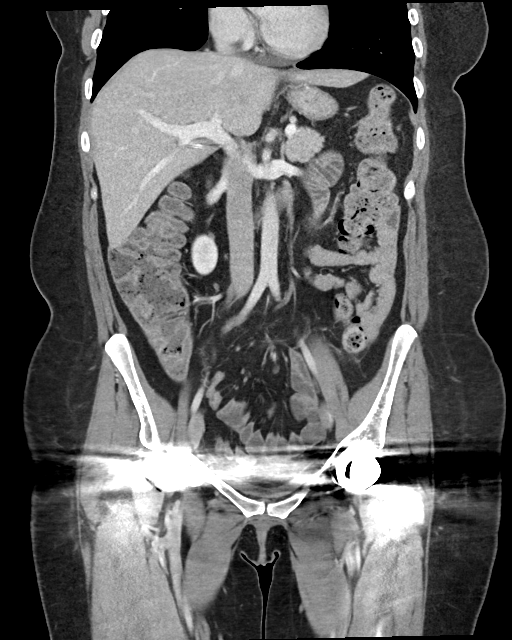
[im 52/94  soft-tissue]
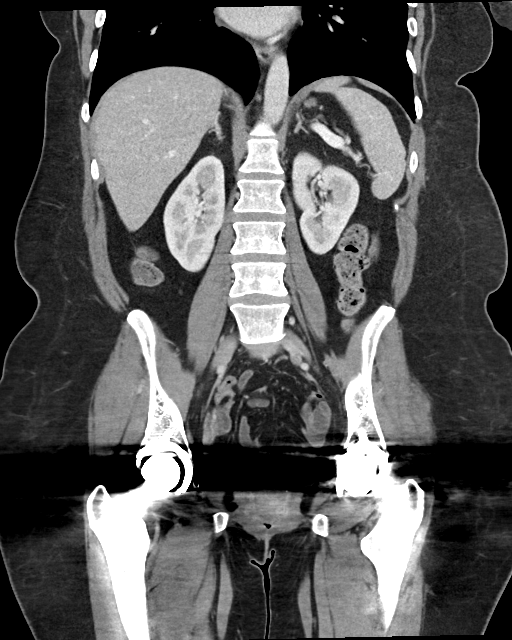

[16 of 46 positions shown; findings below may reference images not displayed]

FINDINGS: Lower chest: No acute abnormality.

Hepatobiliary: No focal liver abnormality is seen. Status post
cholecystectomy. No biliary dilatation.

Pancreas: Unremarkable. No pancreatic ductal dilatation or
surrounding inflammatory changes.

Spleen: Normal in size without focal abnormality.

Adrenals/Urinary Tract: Adrenal glands are within normal limits.
Kidneys demonstrate a normal enhancement pattern bilaterally. No
renal calculi or urinary tract obstructive changes are seen. Ureters
are within normal limits. The bladder is decompressed.

Stomach/Bowel: No obstructive or inflammatory changes of the colon
are seen. The appendix is not well visualized. No inflammatory
changes to suggest appendicitis are noted. Small bowel and stomach
are within normal limits.

Vascular/Lymphatic: No significant vascular findings are present. No
enlarged abdominal or pelvic lymph nodes.

Reproductive: Status post hysterectomy. No adnexal masses.

Other: No abdominal wall hernia or abnormality. No abdominopelvic
ascites.

Musculoskeletal: Bilateral hip replacements are noted. No acute bony
abnormality is seen.
IMPRESSION: The appendix is not well visualized although no inflammatory changes
to suggest appendicitis are seen.

Postsurgical changes without acute abnormality.

## 2022-02-24 ENCOUNTER — Telehealth (INDEPENDENT_AMBULATORY_CARE_PROVIDER_SITE_OTHER): Payer: Medicare PPO | Admitting: Physician Assistant

## 2022-02-24 ENCOUNTER — Encounter: Payer: Self-pay | Admitting: Physician Assistant

## 2022-02-24 DIAGNOSIS — G4700 Insomnia, unspecified: Secondary | ICD-10-CM | POA: Diagnosis not present

## 2022-02-24 DIAGNOSIS — F4310 Post-traumatic stress disorder, unspecified: Secondary | ICD-10-CM

## 2022-02-24 DIAGNOSIS — F3342 Major depressive disorder, recurrent, in full remission: Secondary | ICD-10-CM | POA: Diagnosis not present

## 2022-02-24 DIAGNOSIS — F411 Generalized anxiety disorder: Secondary | ICD-10-CM

## 2022-02-24 MED ORDER — HYDROXYZINE HCL 10 MG PO TABS: 10.0000 mg | ORAL_TABLET | Freq: Three times a day (TID) | ORAL | 3 refills | Status: DC | PRN

## 2022-02-24 MED ORDER — QUETIAPINE FUMARATE 400 MG PO TABS: 800.0000 mg | ORAL_TABLET | Freq: Every day | ORAL | 3 refills | Status: DC

## 2022-02-24 NOTE — Progress Notes (Signed)
[facility]  Patient ID: Jill Bruce,  MRN: [id number]  PCP: No primary care provider on file.  Date of Evaluation: 02/24/2022 Time spent:20 minutes  Chief Complaint:  Chief Complaint   Anxiety; Depression; Insomnia; Follow-up    Virtual Visit via Telehealth  I connected with patient by a video enabled telemedicine application with their informed consent, and verified patient privacy and that I am speaking with the correct person using two identifiers.  I am private, in my office and the patient is at home.  I discussed the limitations, risks, security and privacy concerns of performing an evaluation and management service by video and the availability of in person appointments. I also discussed with the patient that there may be a patient responsible charge related to this service. The patient expressed understanding and agreed to proceed.   I discussed the assessment and treatment plan with the patient. The patient was provided an opportunity to ask questions and all were answered. The patient agreed with the plan and demonstrated an understanding of the instructions.   The patient was advised to call back or seek an in-person evaluation if the symptoms worsen or if the condition fails to improve as anticipated.  I provided 20  minutes of non-face-to-face time during this encounter.  HISTORY/CURRENT STATUS: For routine med check.   Doing well.  Just got back from a cruise vacation with her family and had a good time. Her dtr has covid now, and her husband had arm surgery yesterday, so has a lot going on, but 'it's all good.'  At her last visit 3 months ago we added hydroxyzine to help with anxiety.  She has only taken it a few times but it has been helpful.  Not having panic attacks, just gets overwhelmed.  Both the Xanax and hydroxyzine are helpful.  Patient is able to enjoy things.  Energy and motivation are good.  No extreme sadness, tearfulness, or  feelings of hopelessness.  Sleeps well most of the time, he is trazodone almost every night.  ADLs and personal hygiene are normal.   Denies any changes in concentration, making decisions, or remembering things.  Appetite has not changed.  Weight is stable.   Denies suicidal or homicidal thoughts.  Patient denies increased energy with decreased need for sleep, increased talkativeness, racing thoughts, impulsivity or risky behaviors, increased spending, increased libido, grandiosity, increased irritability or anger, paranoia, and no hallucinations.  Denies dizziness, syncope, seizures, numbness, tingling, tremor, tics, unsteady gait, slurred speech, confusion. Denies muscle or joint pain, stiffness, or dystonia.  Individual Medical History/ Review of Systems: Changes? :No   had labs recently at the [place].  States everything was within normal limits, including glucose.  Past medications for mental health diagnoses include: Trintellix, Remeron, Xanax, Prazosin, Seroquel, Prozac, Wellbutrin, Contrave, Lunesta  Allergies: Chantix [varenicline tartrate], Varenicline, Augmentin [amoxicillin-pot clavulanate], Latex, and Tape  Current Medications:  Current Outpatient Medications:    albuterol (VENTOLIN HFA) 108 (90 Base) MCG/ACT inhaler, Inhale 1-2 puffs into the lungs every 6 (six) hours as needed for wheezing or shortness of breath., Disp: 1 each, Rfl: 0   ALPRAZolam (XANAX) 1 MG tablet, Take 1 tablet (1 mg total) by mouth 3 (three) times daily as needed for anxiety., Disp: 90 tablet, Rfl: 5   budesonide-formoterol (SYMBICORT) 80-4.5 MCG/ACT inhaler, Inhale 2 puffs into the lungs 2 (two) times daily., Disp: , Rfl:    cetirizine (ZYRTEC) 10 MG tablet, Take 10 mg by mouth daily., Disp: , Rfl:  fexofenadine (ALLEGRA) 180 MG tablet, Take 180 mg by mouth daily., Disp: , Rfl:    levETIRAcetam (KEPPRA) 250 MG tablet, Take by mouth in the morning, at noon, and at bedtime., Disp: , Rfl:    MELATONIN CR PO, Take  by mouth., Disp: , Rfl:    montelukast (SINGULAIR) 10 MG tablet, Take 10 mg by mouth at bedtime., Disp: , Rfl:    pantoprazole (PROTONIX) 40 MG tablet, Take 40 mg by mouth daily., Disp: , Rfl:    topiramate (TOPAMAX) 100 MG tablet, Take 100-200 mg by mouth See admin instructions. Take 1 tablet every morning and at lunch then take 2 tablets at bedtime, Disp: , Rfl:    traZODone (DESYREL) 100 MG tablet, TAKE 1 TO 2 TABLETS AT BEDTIME AS NEEDED FOR SLEEP, Disp: 180 tablet, Rfl: 3   hydrOXYzine (ATARAX) 10 MG tablet, Take 1-3 tablets (10-30 mg total) by mouth 3 (three) times daily as needed., Disp: 180 tablet, Rfl: 3   LINZESS 145 MCG CAPS capsule, Take 145 mcg by mouth every 3 (three) days.  (Patient not taking: Reported on 04/12/2020), Disp: , Rfl:    naproxen (NAPROSYN) 500 MG tablet, Take 1 tablet (500 mg total) by mouth 2 (two) times daily as needed. (Patient not taking: Reported on 02/24/2022), Disp: 30 tablet, Rfl: 0   naratriptan (AMERGE) 2.5 MG tablet, Take 2.5 mg by mouth as directed. (Patient not taking: Reported on 04/07/2021), Disp: , Rfl:    predniSONE (DELTASONE) 20 MG tablet, Take 2 tablets (40 mg total) by mouth daily. (Patient not taking: Reported on 04/07/2021), Disp: 10 tablet, Rfl: 0   QUEtiapine (SEROQUEL) 400 MG tablet, Take 2 tablets (800 mg total) by mouth at bedtime., Disp: 180 tablet, Rfl: 3   rizatriptan (MAXALT) 10 MG tablet, Take 10 mg by mouth as needed for migraine. May repeat in 2 hours if needed (Patient not taking: Reported on 04/12/2020), Disp: , Rfl:    verapamil (CALAN-SR) 120 MG CR tablet, Take 120 mg by mouth daily. (Patient not taking: Reported on 10/06/2021), Disp: , Rfl:  Medication Side Effects: none  Family Medical/ Social History: Changes? No  MENTAL HEALTH EXAM:  There were no vitals taken for this visit.There is no height or weight on file to calculate BMI.  General Appearance: Casual, Neat, Well Groomed and Obese  Eye Contact:  Good  Speech:  Clear and  Coherent and Normal Rate  Volume:  Normal  Mood:  Euthymic  Affect: nl    Thought Process:  Goal Directed and Descriptions of Associations: Circumstantial  Orientation:  Full (Time, Place, and Person)  Thought Content: Logical   Suicidal Thoughts:  No  Homicidal Thoughts:  No  Memory:  WNL  Judgement:  Good  Insight:  Good  Psychomotor Activity:  Normal  Concentration:  Concentration: Good and Attention Span: Good  Recall:  Good  Fund of Knowledge: Good  Language: Good  Assets:  Desire for Improvement  ADL's:  Intact  Cognition: WNL  Prognosis:  Good    DIAGNOSES:    ICD-10-CM   1. Posttraumatic stress disorder  F43.10     2. Insomnia, unspecified type  G47.00     3. Generalized anxiety disorder  F41.1     4. Recurrent major depression in full remission ([place])  F33.42       Receiving Psychotherapy: No   RECOMMENDATIONS:  PDMP reviewed.  Last Xanax filled 01/20/2022. I provided 20 minutes of non-face-to-face time during this encounter, including time spent before  and after the visit in records review, medical decision making, counseling pertinent to today's visit, and charting.   She is doing well on the current treatment so no change will be made.  Continue Xanax 1 mg, 1 po tid prn. Continue hydroxyzine 10 mg, 1-3 p.o. 3 times daily as needed anxiety. Continue Seroquel 400 mg, 2 p.o. nightly. Continue Trazodone 100 mg, 1-2 qhs prn sleep.  Return in 6 months.  Melony Overly, PA-C

## 2022-05-12 IMAGING — DX DG CHEST 2V
2 series · 2 of 2 positions shown · non-contrast
Comparison: 03/25/2021

CLINICAL DATA: Chest pain

EXAM:
CHEST - 2 VIEW

[w chest pa]
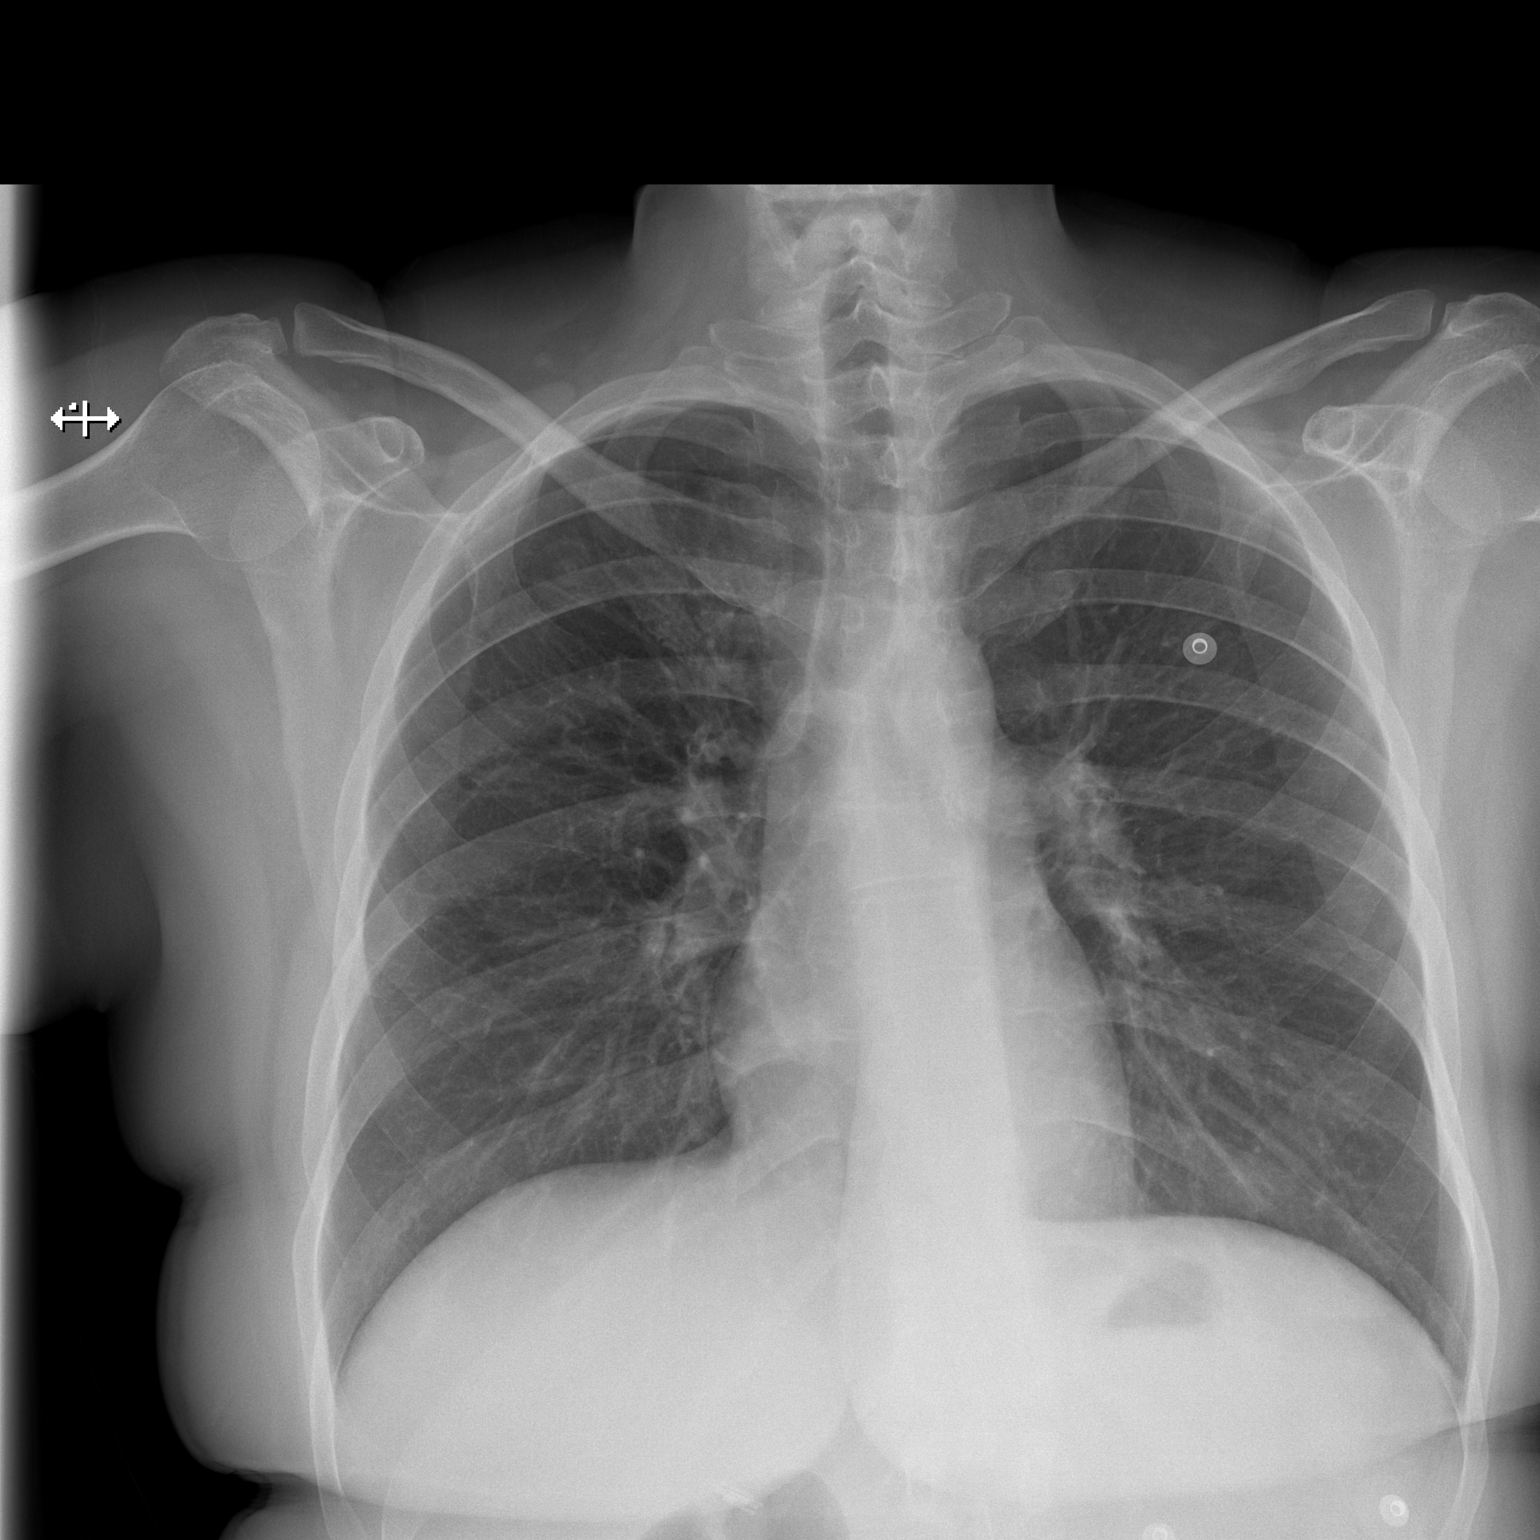

[w chest lat]
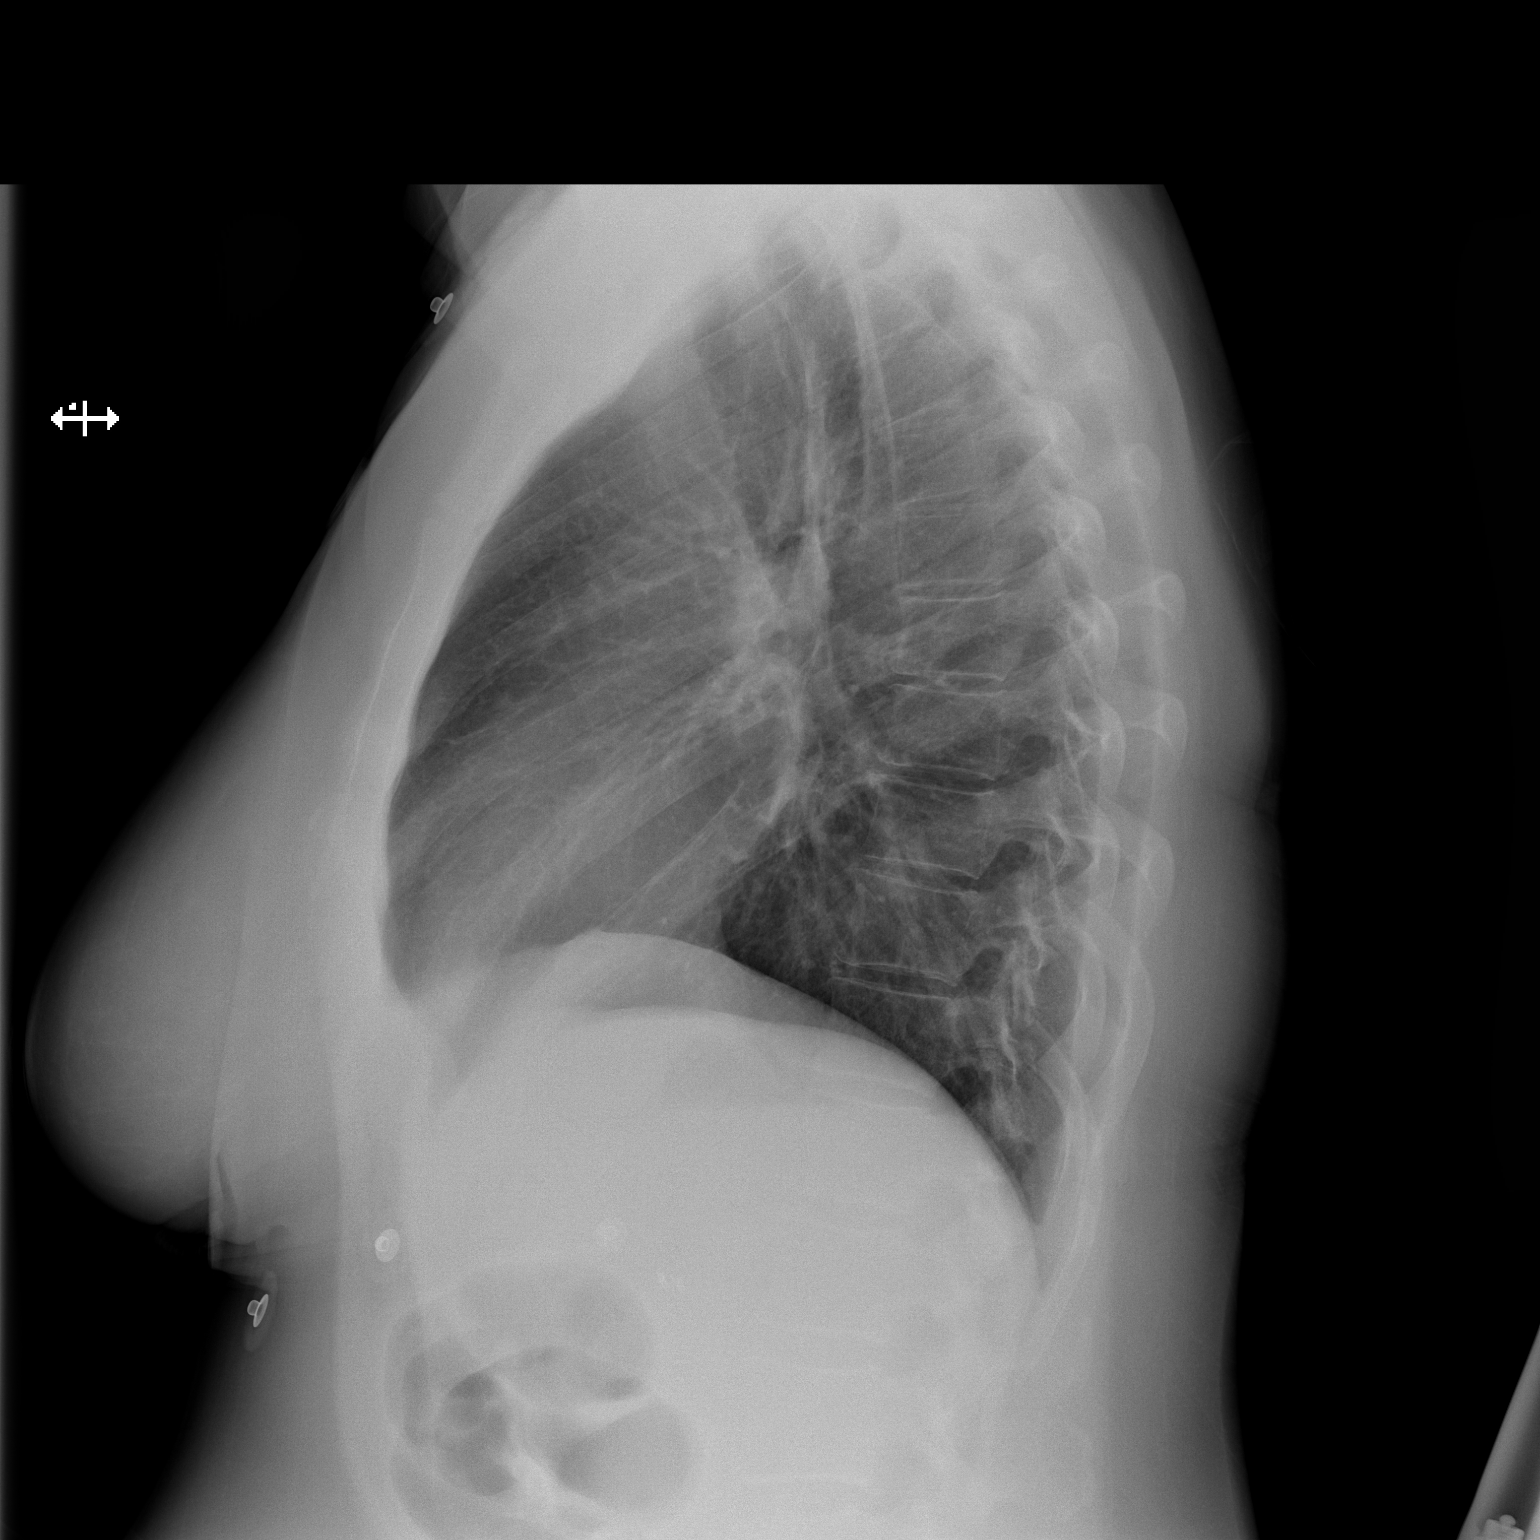

[2 of 2 positions shown; findings below may reference images not displayed]

FINDINGS: Cardiac and mediastinal contours are within normal limits. No focal
pulmonary opacity. No pleural effusion or pneumothorax. No acute
osseous abnormality.
IMPRESSION: No acute cardiopulmonary process.

## 2022-05-31 ENCOUNTER — Other Ambulatory Visit: Payer: Self-pay | Admitting: Physician Assistant

## 2022-06-02 NOTE — Telephone Encounter (Signed)
Filled 10/3 appt 09/02/22

## 2022-09-02 ENCOUNTER — Ambulatory Visit (INDEPENDENT_AMBULATORY_CARE_PROVIDER_SITE_OTHER): Payer: Medicare PPO | Admitting: Physician Assistant

## 2022-09-02 ENCOUNTER — Encounter: Payer: Self-pay | Admitting: Physician Assistant

## 2022-09-02 DIAGNOSIS — F515 Nightmare disorder: Secondary | ICD-10-CM

## 2022-09-02 DIAGNOSIS — Z79899 Other long term (current) drug therapy: Secondary | ICD-10-CM

## 2022-09-02 DIAGNOSIS — F411 Generalized anxiety disorder: Secondary | ICD-10-CM

## 2022-09-02 DIAGNOSIS — F3341 Major depressive disorder, recurrent, in partial remission: Secondary | ICD-10-CM | POA: Diagnosis not present

## 2022-09-02 DIAGNOSIS — F4310 Post-traumatic stress disorder, unspecified: Secondary | ICD-10-CM

## 2022-09-02 DIAGNOSIS — R5381 Other malaise: Secondary | ICD-10-CM

## 2022-09-02 DIAGNOSIS — F4312 Post-traumatic stress disorder, chronic: Secondary | ICD-10-CM

## 2022-09-02 NOTE — Progress Notes (Deleted)
[facility]  Patient ID: Jill Bruce,  MRN: [id number]  PCP: No primary care provider on file.  Date of Evaluation: 09/02/2022 Time spent:30 minutes  Chief Complaint:  Chief Complaint   Anxiety; Depression; Insomnia; Follow-up    HISTORY/CURRENT STATUS: For routine med check.   Tired a lot. Needing to sleep 12-15 hours/day.  Has been this way for a couple of months.  She wakes up to take her kids to school, then goes back home and goes back to bed.  Probably sleeps 7 to 8 hours during the night, then another 2-3 after gets home from dropping them at school, and then takes a 2-hour nap in the afternoon.  No changes in meds or any other factors that may be causing increased somnolence.  Doesn't have any sx of depression. Patient is able to enjoy things.  Energy and motivation are good.  She does not work outside the home.  No extreme sadness, tearfulness, or feelings of hopelessness.   ADLs and personal hygiene are normal.   Denies any changes in concentration, making decisions, or remembering things.  Appetite has not changed.  Weight is stable.  Denies suicidal or homicidal thoughts.  The anxiety is controlled with Xanax.  Not having panic attacks.  She takes the Xanax every night and it helps her relax so she can go to sleep, feels that it probably helps with any nightmares or anxiety during her sleep as well.  The Seroquel has really helped that more than anything.  Patient denies increased energy with decreased need for sleep, increased talkativeness, racing thoughts, impulsivity or risky behaviors, increased spending, increased libido, grandiosity, increased irritability or anger, paranoia, or hallucinations.  Denies dizziness, syncope, seizures, numbness, tingling, tremor, tics, unsteady gait, slurred speech, confusion. Denies muscle or joint pain, stiffness, or dystonia. Denies unexplained weight loss, frequent infections, or sores that heal slowly.  No  polyphagia, polydipsia, or polyuria. Denies visual changes or paresthesias.   Individual Medical History/ Review of Systems: Changes? :Yes    had labs at [facility] a few weeks ago.   Past medications for mental health diagnoses include: Trintellix, Remeron, Xanax, Prazosin, Seroquel, Prozac, Wellbutrin, Contrave, Lunesta  Allergies: Chantix [varenicline tartrate], Varenicline, Augmentin [amoxicillin-pot clavulanate], Latex, and Tape  Current Medications:  Current Outpatient Medications:    albuterol (VENTOLIN HFA) 108 (90 Base) MCG/ACT inhaler, Inhale 1-2 puffs into the lungs every 6 (six) hours as needed for wheezing or shortness of breath., Disp: 1 each, Rfl: 0   ALPRAZolam (XANAX) 1 MG tablet, TAKE 1 TABLET BY MOUTH 3 TIMES DAILY AS NEEDED FOR ANXIETY., Disp: 90 tablet, Rfl: 2   cetirizine (ZYRTEC) 10 MG tablet, Take 10 mg by mouth daily., Disp: , Rfl:    fexofenadine (ALLEGRA) 180 MG tablet, Take 180 mg by mouth daily., Disp: , Rfl:    levETIRAcetam (KEPPRA) 250 MG tablet, Take by mouth in the morning, at noon, and at bedtime., Disp: , Rfl:    MELATONIN CR PO, Take by mouth., Disp: , Rfl:    montelukast (SINGULAIR) 10 MG tablet, Take 10 mg by mouth at bedtime., Disp: , Rfl:    pantoprazole (PROTONIX) 40 MG tablet, Take 40 mg by mouth daily., Disp: , Rfl:    QUEtiapine (SEROQUEL) 400 MG tablet, Take 2 tablets (800 mg total) by mouth at bedtime., Disp: 180 tablet, Rfl: 3   topiramate (TOPAMAX) 100 MG tablet, Take 100-200 mg by mouth See admin instructions. Take 1 tablet every morning and at lunch then take  2 tablets at bedtime, Disp: , Rfl:    traZODone (DESYREL) 100 MG tablet, TAKE 1 TO 2 TABLETS AT BEDTIME AS NEEDED FOR SLEEP, Disp: 180 tablet, Rfl: 3   budesonide-formoterol (SYMBICORT) 80-4.5 MCG/ACT inhaler, Inhale 2 puffs into the lungs 2 (two) times daily. (Patient not taking: Reported on 09/02/2022), Disp: , Rfl:    LINZESS 145 MCG CAPS capsule, Take 145 mcg by mouth every 3 (three) days.   (Patient not taking: Reported on 04/12/2020), Disp: , Rfl:    naproxen (NAPROSYN) 500 MG tablet, Take 1 tablet (500 mg total) by mouth 2 (two) times daily as needed. (Patient not taking: Reported on 02/24/2022), Disp: 30 tablet, Rfl: 0   naratriptan (AMERGE) 2.5 MG tablet, Take 2.5 mg by mouth as directed. (Patient not taking: Reported on 04/07/2021), Disp: , Rfl:    predniSONE (DELTASONE) 20 MG tablet, Take 2 tablets (40 mg total) by mouth daily., Disp: 10 tablet, Rfl: 0   rizatriptan (MAXALT) 10 MG tablet, Take 10 mg by mouth as needed for migraine. May repeat in 2 hours if needed (Patient not taking: Reported on 04/12/2020), Disp: , Rfl:    verapamil (CALAN-SR) 120 MG CR tablet, Take 120 mg by mouth daily. (Patient not taking: Reported on 10/06/2021), Disp: , Rfl:  Medication Side Effects: none  Family Medical/ Social History: Changes? No  MENTAL HEALTH EXAM:  There were no vitals taken for this visit.There is no height or weight on file to calculate BMI.  General Appearance: Casual, Neat, Well Groomed and Obese  Eye Contact:  Good  Speech:  Clear and Coherent and Normal Rate  Volume:  Normal  Mood:  Euthymic  Affect: nl    Thought Process:  Goal Directed and Descriptions of Associations: Circumstantial  Orientation:  Full (Time, Place, and Person)  Thought Content: Logical   Suicidal Thoughts:  No  Homicidal Thoughts:  No  Memory:  WNL  Judgement:  Good  Insight:  Good  Psychomotor Activity:  Normal  Concentration:  Concentration: Good and Attention Span: Good  Recall:  Good  Fund of Knowledge: Good  Language: Good  Assets:  Desire for Improvement Financial Resources/Insurance Housing Transportation  ADL's:  Intact  Cognition: WNL  Prognosis:  Good   Labs under Care Everywhere through the [place].  DIAGNOSES:    ICD-10-CM   1. Recurrent major depressive disorder, in partial remission ([place])  F33.41     2. Malaise and fatigue  R53.81 VITAMIN D 25 Hydroxy (Vit-D Deficiency,  Fractures)   R53.83     3. Generalized anxiety disorder  F41.1     4. Posttraumatic stress disorder  F43.10     5. 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 Encounter for long-term (current) use of medications  Z79.899 VITAMIN D 25 Hydroxy (Vit-D Deficiency, Fractures)     Receiving Psychotherapy: No   RECOMMENDATIONS:  PDMP reviewed.  Last Xanax filled 04/27/2023. I provided 30 minutes of face to face time during this encounter, including time spent before and after the visit in records review, medical decision making, counseling pertinent to today's visit, and charting.   We discussed her labs, no abnormal values as indicating anemia, hyperlipidemia, thyroid disease, diabetes.  I do not see a vitamin D level and recommend that it is done.  That can sometimes affect energy.  Also discussed exercise, healthy diet, sleep hygiene.  Discussed decreasing the trazodone, she may not need that.  She should  take 100 mg nightly for 4 nights, if tolerates that well and is still sleeping okay then decrease to 50 mg every night for at least 4 nights and then stop it.  Continue Xanax 1 mg, 1 po tid prn. Continue Seroquel 400 mg, 2 p.o. nightly. Continue Trazodone 100 mg, 1-2 qhs prn sleep.  Return in 3 months.  Donnal Moat, PA-C

## 2022-09-03 ENCOUNTER — Other Ambulatory Visit: Payer: Self-pay | Admitting: Physician Assistant

## 2022-09-03 DIAGNOSIS — E559 Vitamin D deficiency, unspecified: Secondary | ICD-10-CM

## 2022-09-03 DIAGNOSIS — Z79899 Other long term (current) drug therapy: Secondary | ICD-10-CM

## 2022-09-03 LAB — VITAMIN D 25 HYDROXY (VIT D DEFICIENCY, FRACTURES): Vit D, 25-Hydroxy: 18.4 ng/mL — ABNORMAL LOW (ref 30.0–100.0)

## 2022-09-03 NOTE — Progress Notes (Addendum)
[facility]  Patient ID: Jill Bruce,  MRN: [id number]  PCP: No primary care provider on file.  Date of Evaluation: 09/02/2022 Time spent:30 minutes  Chief Complaint:  Chief Complaint   Anxiety; Depression; Insomnia; Follow-up    HISTORY/CURRENT STATUS: For routine med check.   Doing well except tired all the time.  Is able to do things that she has to do (wife and Mom things) but that's about it. Feels like her meds are working, doesn't feel depressed.  She is able to enjoy things.  No extreme sadness, tearfulness, or feelings of hopelessness.  Sleeps well most of the time, takes trazodone almost every night.  ADLs and personal hygiene are normal.   Denies any changes in concentration, making decisions, or remembering things.  Appetite has not changed.  Weight is stable. Still has anxiety, gets overwhelmed easily, Xanax helps. No PA.  Denies suicidal or homicidal thoughts.  Patient denies increased energy with decreased need for sleep, increased talkativeness, racing thoughts, impulsivity or risky behaviors, increased spending, increased libido, grandiosity, increased irritability or anger, paranoia, or hallucinations.  Denies dizziness, syncope, seizures, numbness, tingling, tremor, tics, unsteady gait, slurred speech, confusion. Denies muscle or joint pain, stiffness, or dystonia.  Individual Medical History/ Review of Systems: Changes? :No   gets routine labs at the [place].  Past medications for mental health diagnoses include: Trintellix, Remeron, Xanax, Prazosin, Seroquel, Prozac, Wellbutrin, Contrave, Lunesta  Allergies: Chantix [varenicline tartrate], Varenicline, Augmentin [amoxicillin-pot clavulanate], Latex, and Tape  Current Medications:  Current Outpatient Medications:    albuterol (VENTOLIN HFA) 108 (90 Base) MCG/ACT inhaler, Inhale 1-2 puffs into the lungs every 6 (six) hours as needed for wheezing or shortness of breath., Disp: 1 each, Rfl: 0    ALPRAZolam (XANAX) 1 MG tablet, TAKE 1 TABLET BY MOUTH 3 TIMES DAILY AS NEEDED FOR ANXIETY., Disp: 90 tablet, Rfl: 2   cetirizine (ZYRTEC) 10 MG tablet, Take 10 mg by mouth daily., Disp: , Rfl:    fexofenadine (ALLEGRA) 180 MG tablet, Take 180 mg by mouth daily., Disp: , Rfl:    levETIRAcetam (KEPPRA) 250 MG tablet, Take by mouth in the morning, at noon, and at bedtime., Disp: , Rfl:    MELATONIN CR PO, Take by mouth., Disp: , Rfl:    montelukast (SINGULAIR) 10 MG tablet, Take 10 mg by mouth at bedtime., Disp: , Rfl:    pantoprazole (PROTONIX) 40 MG tablet, Take 40 mg by mouth daily., Disp: , Rfl:    QUEtiapine (SEROQUEL) 400 MG tablet, Take 2 tablets (800 mg total) by mouth at bedtime., Disp: 180 tablet, Rfl: 3   topiramate (TOPAMAX) 100 MG tablet, Take 100-200 mg by mouth See admin instructions. Take 1 tablet every morning and at lunch then take 2 tablets at bedtime, Disp: , Rfl:    traZODone (DESYREL) 100 MG tablet, TAKE 1 TO 2 TABLETS AT BEDTIME AS NEEDED FOR SLEEP, Disp: 180 tablet, Rfl: 3   budesonide-formoterol (SYMBICORT) 80-4.5 MCG/ACT inhaler, Inhale 2 puffs into the lungs 2 (two) times daily. (Patient not taking: Reported on 09/02/2022), Disp: , Rfl:    LINZESS 145 MCG CAPS capsule, Take 145 mcg by mouth every 3 (three) days.  (Patient not taking: Reported on 04/12/2020), Disp: , Rfl:    naproxen (NAPROSYN) 500 MG tablet, Take 1 tablet (500 mg total) by mouth 2 (two) times daily as needed. (Patient not taking: Reported on 02/24/2022), Disp: 30 tablet, Rfl: 0   naratriptan (AMERGE) 2.5 MG tablet, Take 2.5 mg by  mouth as directed. (Patient not taking: Reported on 04/07/2021), Disp: , Rfl:    predniSONE (DELTASONE) 20 MG tablet, Take 2 tablets (40 mg total) by mouth daily., Disp: 10 tablet, Rfl: 0   rizatriptan (MAXALT) 10 MG tablet, Take 10 mg by mouth as needed for migraine. May repeat in 2 hours if needed (Patient not taking: Reported on 04/12/2020), Disp: , Rfl:    verapamil (CALAN-SR) 120 MG  CR tablet, Take 120 mg by mouth daily. (Patient not taking: Reported on 10/06/2021), Disp: , Rfl:    Vitamin D, Ergocalciferol, (DRISDOL) 1.25 MG (50000 UNIT) CAPS capsule, Take 1 capsule (50,000 Units total) by mouth every 7 (seven) days., Disp: 12 capsule, Rfl: 0 Medication Side Effects: none  Family Medical/ Social History: Changes? No  MENTAL HEALTH EXAM:  There were no vitals taken for this visit.There is no height or weight on file to calculate BMI.  General Appearance: Casual, Neat, Well Groomed and Obese  Eye Contact:  Good  Speech:  Clear and Coherent and Normal Rate  Volume:  Normal  Mood:  Euthymic  Affect: nl    Thought Process:  Goal Directed and Descriptions of Associations: Circumstantial  Orientation:  Full (Time, Place, and Person)  Thought Content: Logical   Suicidal Thoughts:  No  Homicidal Thoughts:  No  Memory:  WNL  Judgement:  Good  Insight:  Good  Psychomotor Activity:  Normal  Concentration:  Concentration: Good and Attention Span: Good  Recall:  Good  Fund of Knowledge: Good  Language: Good  Assets:  Desire for Improvement Financial Resources/Insurance Housing Transportation  ADL's:  Intact  Cognition: WNL  Prognosis:  Good   Labs from 08/20/2022 from [place] reviewed. In Care Everywhere  DIAGNOSES:    ICD-10-CM   1. Recurrent major depressive disorder, in partial remission ([place])  F33.41     2. Generalized anxiety disorder  F41.1     3. Posttraumatic stress disorder  F43.10     4. 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 Encounter for long-term (current) use of medications  Z79.899 VITAMIN D 25 Hydroxy (Vit-D Deficiency, Fractures)     Receiving Psychotherapy: No   RECOMMENDATIONS:  PDMP reviewed.  Last Xanax filled 08/27/2022. I provided 30 minutes of face-to-face time during this encounter, including time spent before and after the visit in records review, medical decision making, counseling  pertinent to today's visit, and charting.   She is doing well on the current treatment so no change will be made. Discussed healthy life style choices that may help w/ fatigue.  Continue Xanax 1 mg, 1 po tid prn. Continue Seroquel 400 mg, 2 p.o. nightly. Continue Trazodone 100 mg, 1-2 qhs prn sleep.  Check Vitamin D level  Return in 3 months.  Donnal Moat, PA-C

## 2022-09-04 ENCOUNTER — Other Ambulatory Visit: Payer: Self-pay

## 2022-09-04 MED ORDER — VITAMIN D (ERGOCALCIFEROL) 1.25 MG (50000 UNIT) PO CAPS: 50000.0000 [IU] | ORAL_CAPSULE | ORAL | 0 refills | Status: DC

## 2022-09-04 NOTE — Progress Notes (Signed)
Noted  

## 2022-10-12 ENCOUNTER — Other Ambulatory Visit: Payer: Self-pay | Admitting: Physician Assistant

## 2022-12-02 ENCOUNTER — Telehealth (INDEPENDENT_AMBULATORY_CARE_PROVIDER_SITE_OTHER): Payer: Medicare PPO | Admitting: Physician Assistant

## 2022-12-02 ENCOUNTER — Encounter: Payer: Self-pay | Admitting: Physician Assistant

## 2022-12-02 DIAGNOSIS — F3341 Major depressive disorder, recurrent, in partial remission: Secondary | ICD-10-CM

## 2022-12-02 DIAGNOSIS — G4700 Insomnia, unspecified: Secondary | ICD-10-CM

## 2022-12-02 DIAGNOSIS — F4312 Post-traumatic stress disorder, chronic: Secondary | ICD-10-CM

## 2022-12-02 DIAGNOSIS — F4310 Post-traumatic stress disorder, unspecified: Secondary | ICD-10-CM | POA: Diagnosis not present

## 2022-12-02 DIAGNOSIS — F515 Nightmare disorder: Secondary | ICD-10-CM | POA: Diagnosis not present

## 2022-12-02 MED ORDER — ALPRAZOLAM 1 MG PO TABS: ORAL_TABLET | ORAL | 5 refills | Status: DC

## 2022-12-02 NOTE — Progress Notes (Addendum)
[facility]  Patient ID: Jill Bruce,  MRN: [id number]  PCP: No primary care provider on file.  Date of Evaluation: 12/02/2022 Time spent:20 minutes  Chief Complaint:  Chief Complaint   Anxiety; Depression; Insomnia; Follow-up   Virtual Visit via Telehealth  I connected with patient by a video enabled telemedicine application with their informed consent, and verified patient privacy and that I am speaking with the correct person using two identifiers.  I am private, in my office and the patient is at home.  I discussed the limitations, risks, security and privacy concerns of performing an evaluation and management service by video and the availability of in person appointments. I also discussed with the patient that there may be a patient responsible charge related to this service. The patient expressed understanding and agreed to proceed.   I discussed the assessment and treatment plan with the patient. The patient was provided an opportunity to ask questions and all were answered. The patient agreed with the plan and demonstrated an understanding of the instructions.   The patient was advised to call back or seek an in-person evaluation if the symptoms worsen or if the condition fails to improve as anticipated.  I provided 20  minutes of non-face-to-face time during this encounter.  HISTORY/CURRENT STATUS: For routine med check.   Doing well with her mental health. Patient is able to enjoy things.  Energy and motivation are good.  No extreme sadness, tearfulness, or feelings of hopelessness.  Sleeps well, needs the Trazodone sometimes and it is effective.  No nightmares now.  Unable to work due to physical health problems.  ADLs and personal hygiene are normal.   Denies any changes in concentration, making decisions, or remembering things.  Appetite has not changed.  Weight is stable.    Denies suicidal or homicidal thoughts.  Anxiety is well-controlled, does  take the Xanax regularly or else she has PA.    Patient denies increased energy with decreased need for sleep, increased talkativeness, racing thoughts, impulsivity or risky behaviors, increased spending, increased libido, grandiosity, increased irritability or anger, paranoia, or hallucinations.  Denies dizziness, syncope, seizures, numbness, tingling, tremor, tics, unsteady gait, slurred speech, confusion. Denies muscle or joint pain, stiffness, or dystonia.  Individual Medical History/ Review of Systems: Changes? :No   gets routine labs at the [place]. Sees Neuro for seizures, she may have had a seizure, petite mal, when she was at a concert recently.  Past medications for mental health diagnoses include: Trintellix, Remeron, Xanax, Prazosin, Seroquel, Prozac, Wellbutrin, Contrave, Lunesta  Allergies: Chantix [varenicline tartrate], Varenicline, Augmentin [amoxicillin-pot clavulanate], Latex, and Tape  Current Medications:  Current Outpatient Medications:    albuterol (VENTOLIN HFA) 108 (90 Base) MCG/ACT inhaler, Inhale 1-2 puffs into the lungs every 6 (six) hours as needed for wheezing or shortness of breath., Disp: 1 each, Rfl: 0   cetirizine (ZYRTEC) 10 MG tablet, Take 10 mg by mouth daily., Disp: , Rfl:    fexofenadine (ALLEGRA) 180 MG tablet, Take 180 mg by mouth daily., Disp: , Rfl:    levETIRAcetam (KEPPRA) 250 MG tablet, Take by mouth in the morning, at noon, and at bedtime., Disp: , Rfl:    MELATONIN CR PO, Take by mouth., Disp: , Rfl:    montelukast (SINGULAIR) 10 MG tablet, Take 10 mg by mouth at bedtime., Disp: , Rfl:    naproxen (NAPROSYN) 500 MG tablet, Take 1 tablet (500 mg total) by mouth 2 (two) times daily as needed., Disp: 30 tablet,  Rfl: 0   pantoprazole (PROTONIX) 40 MG tablet, Take 40 mg by mouth daily., Disp: , Rfl:    QUEtiapine (SEROQUEL) 400 MG tablet, Take 2 tablets (800 mg total) by mouth at bedtime., Disp: 180 tablet, Rfl: 3   topiramate (TOPAMAX) 100 MG tablet,  Take 100-200 mg by mouth See admin instructions. 200 mg in am, 200 mg in the afternoon, 300 mg qhs., Disp: , Rfl:    traZODone (DESYREL) 100 MG tablet, TAKE 1 TO 2 TABLETS AT BEDTIME AS NEEDED FOR SLEEP, Disp: 180 tablet, Rfl: 3   ALPRAZolam (XANAX) 1 MG tablet, TAKE 1 TABLET BY MOUTH THREE TIMES A DAY AS NEEDED FOR ANXIETY, Disp: 90 tablet, Rfl: 5   naratriptan (AMERGE) 2.5 MG tablet, Take 2.5 mg by mouth as directed. (Patient not taking: Reported on 04/07/2021), Disp: , Rfl:    rizatriptan (MAXALT) 10 MG tablet, Take 10 mg by mouth as needed for migraine. May repeat in 2 hours if needed (Patient not taking: Reported on 04/12/2020), Disp: , Rfl:    verapamil (CALAN-SR) 120 MG CR tablet, Take 120 mg by mouth daily. (Patient not taking: Reported on 10/06/2021), Disp: , Rfl:  Medication Side Effects: none  Family Medical/ Social History: Changes? No  MENTAL HEALTH EXAM:  There were no vitals taken for this visit.There is no height or weight on file to calculate BMI.  General Appearance: Casual, Neat, Well Groomed and Obese  Eye Contact:  Good  Speech:  Clear and Coherent and Normal Rate  Volume:  Normal  Mood:  Euthymic  Affect: nl    Thought Process:  Goal Directed and Descriptions of Associations: Circumstantial  Orientation:  Full (Time, Place, and Person)  Thought Content: Logical   Suicidal Thoughts:  No  Homicidal Thoughts:  No  Memory:  WNL  Judgement:  Good  Insight:  Good  Psychomotor Activity:  Normal  Concentration:  Concentration: Good and Attention Span: Good  Recall:  Good  Fund of Knowledge: Good  Language: Good  Assets:  Communication Skills Desire for Improvement Financial Resources/Insurance Housing Transportation  ADL's:  Intact  Cognition: WNL  Prognosis:  Good   Labs from 08/20/2022 from [place] reviewed. In Care Everywhere  DIAGNOSES:    ICD-10-CM   1. Recurrent major depressive disorder, in partial remission (HCC)  F33.41     2. Posttraumatic stress disorder   F43.10     3. 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 Insomnia, unspecified type  G47.00       Receiving Psychotherapy: No   RECOMMENDATIONS:  PDMP reviewed.  Last Xanax filled 10/13/2022.  I provided  20  minutes of non-face to face time during this encounter, including time spent before and after the visit in records review, medical decision making, counseling pertinent to today's visit, and charting.   She is doing well so no changes need to be made. Vaping cessation discussed. She's not ready to quit.  Continue Xanax 1 mg, 1 po tid prn. Continue Seroquel 400 mg, 2 p.o. nightly. Continue Trazodone 100 mg, 1-2 qhs prn sleep.  Return in 6 months.  Melony Overly, PA-C

## 2023-02-19 ENCOUNTER — Other Ambulatory Visit: Payer: Self-pay | Admitting: Physician Assistant

## 2023-05-30 ENCOUNTER — Other Ambulatory Visit: Payer: Self-pay

## 2023-05-31 MED ORDER — ALPRAZOLAM 1 MG PO TABS: ORAL_TABLET | ORAL | 1 refills | Status: DC

## 2023-06-02 ENCOUNTER — Telehealth: Payer: Medicare PPO | Admitting: Physician Assistant

## 2023-06-02 ENCOUNTER — Encounter: Payer: Self-pay | Admitting: Physician Assistant

## 2023-06-02 DIAGNOSIS — F3341 Major depressive disorder, recurrent, in partial remission: Secondary | ICD-10-CM

## 2023-06-02 DIAGNOSIS — F411 Generalized anxiety disorder: Secondary | ICD-10-CM | POA: Diagnosis not present

## 2023-06-02 DIAGNOSIS — F4310 Post-traumatic stress disorder, unspecified: Secondary | ICD-10-CM | POA: Diagnosis not present

## 2023-06-02 DIAGNOSIS — G4700 Insomnia, unspecified: Secondary | ICD-10-CM | POA: Diagnosis not present

## 2023-06-02 NOTE — Progress Notes (Signed)
[facility]  Patient ID: Jill Bruce,  MRN: [id number]  PCP: No primary care provider on file.  Date of Evaluation: 06/02/2023 Time spent:25 minutes  Chief Complaint:  Chief Complaint   Follow-up    Virtual Visit via Telehealth  I connected with patient by a video enabled telemedicine application with their informed consent, and verified patient privacy and that I am speaking with the correct person using two identifiers.  I am private, in my office and the patient is at home.  I discussed the limitations, risks, security and privacy concerns of performing an evaluation and management service by video and the availability of in person appointments. I also discussed with the patient that there may be a patient responsible charge related to this service. The patient expressed understanding and agreed to proceed.   I discussed the assessment and treatment plan with the patient. The patient was provided an opportunity to ask questions and all were answered. The patient agreed with the plan and demonstrated an understanding of the instructions.   The patient was advised to call back or seek an in-person evaluation if the symptoms worsen or if the condition fails to improve as anticipated.  I provided 25 minutes of non-face-to-face time during this encounter.  HISTORY/CURRENT STATUS: For routine med check.   She went to [facility] a few weeks ago, forgot all meds. Went without the Seroquel for 2 nights.  She was able to get the Xanax transferred but did go without it for 1 day.  So that was hard.  After she got the medicine back in her system she was fine.  Patient is able to enjoy things.  Energy and motivation are good.  She does not work due to physical and mental reasons.  No extreme sadness, tearfulness, or feelings of hopelessness.  Sleeps well as long as she has the Seroquel.  No nightmares.  ADLs and personal hygiene are normal.   Denies any changes in  concentration, making decisions, or remembering things.  Appetite has not changed.  Weight is stable.  Anxiety is well controlled.  She does need the Xanax daily because she will get too overwhelmed if she does not take it.  She is not having panic attacks.  Denies suicidal or homicidal thoughts.  Patient denies increased energy with decreased need for sleep, increased talkativeness, racing thoughts, impulsivity or risky behaviors, increased spending, increased libido, grandiosity, increased irritability or anger, paranoia, or hallucinations.  Denies dizziness, syncope, seizures, numbness, tingling, tremor, tics, unsteady gait, slurred speech, confusion. Denies muscle or joint pain, stiffness, or dystonia.  Individual Medical History/ Review of Systems: Changes? :No      Past medications for mental health diagnoses include: Trintellix, Remeron, Xanax, Prazosin, Seroquel, Prozac, Wellbutrin, Contrave, Lunesta  Allergies: Chantix [varenicline tartrate], Varenicline, Augmentin [amoxicillin-pot clavulanate], Latex, and Tape  Current Medications:  Current Outpatient Medications:    albuterol (VENTOLIN HFA) 108 (90 Base) MCG/ACT inhaler, Inhale 1-2 puffs into the lungs every 6 (six) hours as needed for wheezing or shortness of breath., Disp: 1 each, Rfl: 0   ALPRAZolam (XANAX) 1 MG tablet, TAKE 1 TABLET BY MOUTH THREE TIMES A DAY AS NEEDED FOR ANXIETY, Disp: 90 tablet, Rfl: 1   cetirizine (ZYRTEC) 10 MG tablet, Take 10 mg by mouth daily., Disp: , Rfl:    cholecalciferol (VITAMIN D3) 25 MCG (1000 UNIT) tablet, Take 2,000 Units by mouth daily., Disp: , Rfl:    fexofenadine (ALLEGRA) 180 MG tablet, Take 180 mg by mouth  daily., Disp: , Rfl:    levETIRAcetam (KEPPRA) 250 MG tablet, Take by mouth in the morning, at noon, and at bedtime., Disp: , Rfl:    MELATONIN CR PO, Take by mouth., Disp: , Rfl:    montelukast (SINGULAIR) 10 MG tablet, Take 10 mg by mouth at bedtime., Disp: , Rfl:    naproxen  (NAPROSYN) 500 MG tablet, Take 1 tablet (500 mg total) by mouth 2 (two) times daily as needed., Disp: 30 tablet, Rfl: 0   pantoprazole (PROTONIX) 40 MG tablet, Take 40 mg by mouth daily., Disp: , Rfl:    QUEtiapine (SEROQUEL) 400 MG tablet, TAKE 2 TABLETS AT BEDTIME, Disp: 180 tablet, Rfl: 3   topiramate (TOPAMAX) 100 MG tablet, Take 100-200 mg by mouth See admin instructions. 200 mg in am, 200 mg in the afternoon, 300 mg qhs., Disp: , Rfl:    naratriptan (AMERGE) 2.5 MG tablet, Take 2.5 mg by mouth as directed. (Patient not taking: Reported on 04/07/2021), Disp: , Rfl:    rizatriptan (MAXALT) 10 MG tablet, Take 10 mg by mouth as needed for migraine. May repeat in 2 hours if needed (Patient not taking: Reported on 04/12/2020), Disp: , Rfl:    verapamil (CALAN-SR) 120 MG CR tablet, Take 120 mg by mouth daily. (Patient not taking: Reported on 10/06/2021), Disp: , Rfl:  Medication Side Effects: none  Family Medical/ Social History: Changes? No  MENTAL HEALTH EXAM:  There were no vitals taken for this visit.There is no height or weight on file to calculate BMI.  General Appearance: Casual, Neat, Well Groomed and Obese  Eye Contact:  Good  Speech:  Clear and Coherent and Normal Rate  Volume:  Normal  Mood:  Euthymic  Affect: nl    Thought Process:  Goal Directed and Descriptions of Associations: Circumstantial  Orientation:  Full (Time, Place, and Person)  Thought Content: Logical   Suicidal Thoughts:  No  Homicidal Thoughts:  No  Memory:  WNL  Judgement:  Good  Insight:  Good  Psychomotor Activity:  Normal  Concentration:  Concentration: Good and Attention Span: Good  Recall:  Good  Fund of Knowledge: Good  Language: Good  Assets:  Communication Skills Desire for Improvement Financial Resources/Insurance Housing Resilience Transportation  ADL's:  Intact  Cognition: WNL  Prognosis:  Good   PCP follows labs  DIAGNOSES:    ICD-10-CM   1. Recurrent major depressive disorder, in  partial remission (HCC)  F33.41     2. Posttraumatic stress disorder  F43.10     3. Insomnia, unspecified type  G47.00     4. Generalized anxiety disorder  F41.1      Receiving Psychotherapy: No   RECOMMENDATIONS:  PDMP reviewed.  Last Xanax filled 05/31/2023.  I provided 25 minutes of non-face-to-face time during this encounter, including time spent before and after the visit in records review, medical decision making, counseling pertinent to today's visit, and charting.   I provided approximately 18 minutes in counseling concerning vaping cessation.  She is not ready to quit but will let me know if/when she is in the future and I will help as much as possible.    She is doing well with current medications so no changes will be made.  Continue Xanax 1 mg, 1 po tid prn. Continue Seroquel 400 mg, 2 p.o. nightly.  Return in 6 months.  Melony Overly, PA-C

## 2023-09-02 ENCOUNTER — Other Ambulatory Visit: Payer: Self-pay | Admitting: Physician Assistant

## 2023-09-02 NOTE — Telephone Encounter (Signed)
Lf 12/29

## 2023-11-29 ENCOUNTER — Encounter: Payer: Self-pay | Admitting: Physician Assistant

## 2023-11-29 ENCOUNTER — Telehealth: Payer: Medicare PPO | Admitting: Physician Assistant

## 2023-11-29 DIAGNOSIS — F4312 Post-traumatic stress disorder, chronic: Secondary | ICD-10-CM

## 2023-11-29 DIAGNOSIS — G4700 Insomnia, unspecified: Secondary | ICD-10-CM | POA: Diagnosis not present

## 2023-11-29 DIAGNOSIS — F515 Nightmare disorder: Secondary | ICD-10-CM | POA: Diagnosis not present

## 2023-11-29 DIAGNOSIS — F4310 Post-traumatic stress disorder, unspecified: Secondary | ICD-10-CM | POA: Diagnosis not present

## 2023-11-29 DIAGNOSIS — F4323 Adjustment disorder with mixed anxiety and depressed mood: Secondary | ICD-10-CM | POA: Diagnosis not present

## 2023-11-29 MED ORDER — DULOXETINE HCL 30 MG PO CPEP: 30.0000 mg | ORAL_CAPSULE | Freq: Every day | ORAL | 0 refills | Status: DC

## 2023-11-29 MED ORDER — DULOXETINE HCL 60 MG PO CPEP: 60.0000 mg | ORAL_CAPSULE | Freq: Every day | ORAL | 1 refills | Status: DC
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
## 2023-12-21 ENCOUNTER — Ambulatory Visit: Admitting: Physician Assistant

## 2024-01-12 ENCOUNTER — Encounter: Payer: Self-pay | Admitting: Physician Assistant

## 2024-01-12 ENCOUNTER — Telehealth: Admitting: Physician Assistant

## 2024-01-12 DIAGNOSIS — F4310 Post-traumatic stress disorder, unspecified: Secondary | ICD-10-CM

## 2024-01-12 DIAGNOSIS — F411 Generalized anxiety disorder: Secondary | ICD-10-CM

## 2024-01-12 DIAGNOSIS — F3341 Major depressive disorder, recurrent, in partial remission: Secondary | ICD-10-CM

## 2024-01-12 DIAGNOSIS — G4700 Insomnia, unspecified: Secondary | ICD-10-CM

## 2024-01-12 DIAGNOSIS — G894 Chronic pain syndrome: Secondary | ICD-10-CM

## 2024-01-12 MED ORDER — DULOXETINE HCL 30 MG PO CPEP: 30.0000 mg | ORAL_CAPSULE | Freq: Every day | ORAL | 0 refills | Status: DC

## 2024-01-12 MED ORDER — DULOXETINE HCL 30 MG PO CPEP: 90.0000 mg | ORAL_CAPSULE | Freq: Every day | ORAL | 1 refills | Status: DC

## 2024-01-12 NOTE — Progress Notes (Signed)
 [facility]  Patient ID: Jill Bruce,  MRN: [id number]  PCP: No primary care provider on file.  Date of Evaluation: 11/29/2023 Time spent:20 minutes  Chief Complaint:  Chief Complaint   Anxiety; Depression    Virtual Visit via Telehealth  I connected with patient by a video enabled telemedicine application with their informed consent, and verified patient privacy and that I am speaking with the correct person using two identifiers.  I am private, in my office and the patient is at home.  I discussed the limitations, risks, security and privacy concerns of performing an evaluation and management service by video and the availability of in person appointments. I also discussed with the patient that there may be a patient responsible charge related to this service. The patient expressed understanding and agreed to proceed.   I discussed the assessment and treatment plan with the patient. The patient was provided an opportunity to ask questions and all were answered. The patient agreed with the plan and demonstrated an understanding of the instructions.   The patient was advised to call back or seek an in-person evaluation if the symptoms worsen or if the condition fails to improve as anticipated.  I provided 20  minutes of non-face-to-face time during this encounter.  HISTORY/CURRENT STATUS: For routine med check.   Is responding very well to the Cymbalta  that was added 6 weeks ago.  It has really helped her chronic muscle and joint pain as well as her mood.  She is not as anxious.  She asks if the dose can be increased though.  She is happy with the response but thinks she could feel even better with her mood.   Energy and motivation are good most of the time.  No extreme sadness, tearfulness, or feelings of hopelessness.  Sleeps well as long as she takes the Seroquel  but if she does not she can go several days without sleeping.  She has a sleep study scheduled  tonight, by her provider at the [facility]. ADLs and personal hygiene are normal.   No change in focus or memory.  Appetite has not changed.  Weight is stable.  Anxiety has improved since starting the Cymbalta .  She still needs the Xanax  for generalized anxiety, if she does not take it she can get panicky.  No delirium, mania, or psychosis.  Denies suicidal or homicidal thoughts.  Denies dizziness, syncope, seizures, numbness, tingling, tremor, tics, unsteady gait, slurred speech, confusion. Denies dystonia. Denies unexplained weight loss, frequent infections, or sores that heal slowly.  No polyphagia, polydipsia, or polyuria. Denies visual changes or paresthesias.   Individual Medical History/ Review of Systems: Changes? :Yes    see HPI  Past medications for mental health diagnoses include: Trintellix, Remeron, Xanax , Prazosin, Seroquel , Prozac, Wellbutrin, Contrave, Lunesta  Allergies: Chantix [varenicline tartrate], Varenicline, Augmentin [amoxicillin-pot clavulanate], Latex, and Tape  Current Medications:  Current Outpatient Medications:    albuterol  (VENTOLIN  HFA) 108 (90 Base) MCG/ACT inhaler, Inhale 1-2 puffs into the lungs every 6 (six) hours as needed for wheezing or shortness of breath., Disp: 1 each, Rfl: 0   ALPRAZolam  (XANAX ) 1 MG tablet, TAKE 1 TABLET BY MOUTH THREE TIMES A DAY AS NEEDED FOR ANXIETY, Disp: 90 tablet, Rfl: 5   cetirizine (ZYRTEC) 10 MG tablet, Take 10 mg by mouth daily., Disp: , Rfl:    cholecalciferol (VITAMIN D3) 25 MCG (1000 UNIT) tablet, Take 2,000 Units by mouth daily., Disp: , Rfl:    DULoxetine  (CYMBALTA ) 30 MG capsule,  Take 1 capsule (30 mg total) by mouth daily. Take with 60 mg=90 mg, Disp: 30 capsule, Rfl: 0   fexofenadine (ALLEGRA) 180 MG tablet, Take 180 mg by mouth daily., Disp: , Rfl:    levETIRAcetam (KEPPRA) 250 MG tablet, Take by mouth in the morning, at noon, and at bedtime., Disp: , Rfl:    MELATONIN CR PO, Take by mouth., Disp: , Rfl:    montelukast  (SINGULAIR) 10 MG tablet, Take 10 mg by mouth at bedtime., Disp: , Rfl:    naproxen  (NAPROSYN ) 500 MG tablet, Take 1 tablet (500 mg total) by mouth 2 (two) times daily as needed., Disp: 30 tablet, Rfl: 0   pantoprazole  (PROTONIX ) 40 MG tablet, Take 40 mg by mouth daily., Disp: , Rfl:    QUEtiapine  (SEROQUEL ) 400 MG tablet, TAKE 2 TABLETS AT BEDTIME, Disp: 180 tablet, Rfl: 3   topiramate (TOPAMAX) 100 MG tablet, Take 100-200 mg by mouth See admin instructions. 200 mg in am, 200 mg in the afternoon, 300 mg qhs., Disp: , Rfl:    DULoxetine  (CYMBALTA ) 30 MG capsule, Take 3 capsules (90 mg total) by mouth daily., Disp: 270 capsule, Rfl: 1 Medication Side Effects: none  Family Medical/ Social History: Changes? No  MENTAL HEALTH EXAM:  There were no vitals taken for this visit.There is no height or weight on file to calculate BMI.  General Appearance: Casual and Well Groomed  Eye Contact:  Good  Speech:  Clear and Coherent and Normal Rate  Volume:  Normal  Mood:  Euthymic  Affect: congruent  Thought Process:  Goal Directed and Descriptions of Associations: Circumstantial  Orientation:  Full (Time, Place, and Person)  Thought Content: Logical   Suicidal Thoughts:  No  Homicidal Thoughts:  No  Memory:  WNL  Judgement:  Good  Insight:  Good  Psychomotor Activity:  Normal  Concentration:  Concentration: Good and Attention Span: Good  Recall:  Good  Fund of Knowledge: Good  Language: Good  Assets:  Communication Skills Desire for Improvement Financial Resources/Insurance Housing Resilience Transportation  ADL's:  Intact  Cognition: WNL  Prognosis:  Good   PCP follows labs  DIAGNOSES:    ICD-10-CM   1. Recurrent major depressive disorder, in partial remission (HCC)  F33.41     2. Posttraumatic stress disorder  F43.10     3. Insomnia, unspecified type  G47.00     4. Generalized anxiety disorder  F41.1     5. Chronic pain syndrome  G89.4       Receiving Psychotherapy: No    RECOMMENDATIONS:  PDMP reviewed.  Last Xanax  filled 01/03/2024. I provided approximately  20 minutes of non-face-to-face time during this encounter, including time spent before and after the visit in records review, medical decision making, counseling pertinent to today's visit, and charting.   I am glad to see her doing better!  I think increasing the dose of Cymbalta  is appropriate.  No other changes will be made.  Continue Xanax  1 mg, 1 po tid prn. Increase Cymbalta  to a total of 9 mg daily. Continue Seroquel  400 mg, 2 p.o. nightly.  Return in 3 months.   Verneita Cooks, PA-C

## 2024-01-30 ENCOUNTER — Other Ambulatory Visit: Payer: Self-pay | Admitting: Physician Assistant

## 2024-02-13 ENCOUNTER — Other Ambulatory Visit: Payer: Self-pay | Admitting: Physician Assistant

## 2024-02-14 ENCOUNTER — Other Ambulatory Visit: Payer: Self-pay | Admitting: Physician Assistant

## 2024-04-12 ENCOUNTER — Other Ambulatory Visit: Payer: Self-pay

## 2024-04-12 ENCOUNTER — Other Ambulatory Visit: Payer: Self-pay | Admitting: Physician Assistant

## 2024-04-12 DIAGNOSIS — F411 Generalized anxiety disorder: Secondary | ICD-10-CM

## 2024-04-13 ENCOUNTER — Ambulatory Visit: Admitting: Physician Assistant

## 2024-04-14 MED ORDER — ALPRAZOLAM 1 MG PO TABS: ORAL_TABLET | ORAL | 0 refills | Status: DC

## 2024-04-14 NOTE — Telephone Encounter (Signed)
 Pt called and said that she needs a refill on her xanax  1 mg 3 times a day as needed. Pharmacy is [facility] in [place] [address]

## 2024-05-02 ENCOUNTER — Ambulatory Visit: Admitting: Physician Assistant

## 2024-05-02 ENCOUNTER — Encounter: Payer: Self-pay | Admitting: Physician Assistant

## 2024-05-02 DIAGNOSIS — F3341 Major depressive disorder, recurrent, in partial remission: Secondary | ICD-10-CM | POA: Diagnosis not present

## 2024-05-02 DIAGNOSIS — G4700 Insomnia, unspecified: Secondary | ICD-10-CM | POA: Diagnosis not present

## 2024-05-02 DIAGNOSIS — F4310 Post-traumatic stress disorder, unspecified: Secondary | ICD-10-CM | POA: Diagnosis not present

## 2024-05-02 DIAGNOSIS — F411 Generalized anxiety disorder: Secondary | ICD-10-CM

## 2024-05-02 MED ORDER — ALPRAZOLAM 1 MG PO TABS: ORAL_TABLET | ORAL | 5 refills | Status: DC

## 2024-05-02 MED ORDER — QUETIAPINE FUMARATE 400 MG PO TABS: 800.0000 mg | ORAL_TABLET | Freq: Every day | ORAL | 1 refills | Status: DC

## 2024-05-02 MED ORDER — TRAZODONE HCL 100 MG PO TABS: 150.0000 mg | ORAL_TABLET | Freq: Every evening | ORAL | 1 refills | Status: AC | PRN

## 2024-05-02 NOTE — Progress Notes (Signed)
 [facility]  Patient ID: Jill Bruce,  MRN: [id number]  PCP: No primary care provider on file.  Date of Evaluation: 05/02/2024 Time spent:20 minutes  Chief Complaint:  Chief Complaint   Depression; Anxiety; Follow-up    HISTORY/CURRENT STATUS: For routine med check.   Has been under stress with health issue of her Mom.  She is able to enjoy things.  Energy and motivation are good.  No extreme sadness, tearfulness, or feelings of hopelessness.  Sleeps ok.  ADLs and personal hygiene are normal.   Denies any changes in concentration, making decisions, or remembering things.  Appetite has not changed. Anxiety is controlled with the  Xanax . No SI/HI.  Patient denies increased energy with decreased need for sleep, increased talkativeness, racing thoughts, impulsivity or risky behaviors, increased spending, increased libido, grandiosity, increased irritability or anger, paranoia, or hallucinations.  Individual Medical History/ Review of Systems: Changes? :No       Past medications for mental health diagnoses include: Trintellix, Remeron, Xanax , Prazosin, Seroquel , Prozac, Wellbutrin, Contrave, Lunesta  Allergies: Chantix [varenicline tartrate], Varenicline, Augmentin [amoxicillin-pot clavulanate], Latex, and Tape  Current Medications:  Current Outpatient Medications:    albuterol  (VENTOLIN  HFA) 108 (90 Base) MCG/ACT inhaler, Inhale 1-2 puffs into the lungs every 6 (six) hours as needed for wheezing or shortness of breath., Disp: 1 each, Rfl: 0   budesonide (PULMICORT FLEXHALER) 180 MCG/ACT inhaler, Inhale 2 puffs into the lungs., Disp: , Rfl:    cetirizine (ZYRTEC) 10 MG tablet, Take 10 mg by mouth daily., Disp: , Rfl:    cholecalciferol (VITAMIN D3) 25 MCG (1000 UNIT) tablet, Take 2,000 Units by mouth daily., Disp: , Rfl:    DULoxetine  (CYMBALTA ) 30 MG capsule, Take 3 capsules (90 mg total) by mouth daily., Disp: 270 capsule, Rfl: 1   fexofenadine (ALLEGRA) 180  MG tablet, Take 180 mg by mouth daily., Disp: , Rfl:    levETIRAcetam (KEPPRA) 250 MG tablet, Take by mouth in the morning, at noon, and at bedtime., Disp: , Rfl:    MELATONIN CR PO, Take by mouth., Disp: , Rfl:    montelukast (SINGULAIR) 10 MG tablet, Take 10 mg by mouth at bedtime., Disp: , Rfl:    naproxen  (NAPROSYN ) 500 MG tablet, Take 1 tablet (500 mg total) by mouth 2 (two) times daily as needed., Disp: 30 tablet, Rfl: 0   pantoprazole  (PROTONIX ) 40 MG tablet, Take 40 mg by mouth daily., Disp: , Rfl:    topiramate (TOPAMAX) 100 MG tablet, Take 100-200 mg by mouth See admin instructions. 200 mg in am, 200 mg in the afternoon, 300 mg qhs., Disp: , Rfl:    ALPRAZolam  (XANAX ) 1 MG tablet, TAKE 1 TABLET BY MOUTH THREE TIMES A DAY AS NEEDED FOR ANXIETY, Disp: 90 tablet, Rfl: 5   QUEtiapine  (SEROQUEL ) 400 MG tablet, Take 2 tablets (800 mg total) by mouth at bedtime., Disp: 180 tablet, Rfl: 1   traZODone  (DESYREL ) 100 MG tablet, Take 1.5 tablets (150 mg total) by mouth at bedtime as needed for sleep., Disp: 135 tablet, Rfl: 1 Medication Side Effects: none  Family Medical/ Social History: Changes? Mom moved to [place] , then about 3 weeks ago, she was rushed to the hospital, found to have 26% function of her heart. Having CABG next month.   MENTAL HEALTH EXAM:  There were no vitals taken for this visit.There is no height or weight on file to calculate BMI.  General Appearance: Casual and Well Groomed  Eye Contact:  Good  Speech:  Clear and Coherent and Normal Rate  Volume:  Normal  Mood:  Euthymic  Affect: congruent  Thought Process:  Goal Directed and Descriptions of Associations: Circumstantial  Orientation:  Full (Time, Place, and Person)  Thought Content: Logical   Suicidal Thoughts:  No  Homicidal Thoughts:  No  Memory:  WNL  Judgement:  Good  Insight:  Good  Psychomotor Activity:  Normal  Concentration:  Concentration: Good and Attention Span: Good  Recall:  Good  Fund of  Knowledge: Good  Language: Good  Assets:  Communication Skills Desire for Improvement Financial Resources/Insurance Housing Leisure Time Resilience Transportation  ADL's:  Intact  Cognition: WNL  Prognosis:  Good   PCP follows labs  DIAGNOSES:    ICD-10-CM   1. Recurrent major depressive disorder, in partial remission  F33.41     2. Generalized anxiety disorder  F41.1 ALPRAZolam  (XANAX ) 1 MG tablet    3. Insomnia, unspecified type  G47.00     4. Posttraumatic stress disorder  F43.10      Receiving Psychotherapy: No   RECOMMENDATIONS:  PDMP reviewed.  Last Xanax  filled 04/14/2024. I provided approximately 20 minutes of face to face time during this encounter, including time spent before and after the visit in records review, medical decision making, counseling pertinent to today's visit, and charting.   She's doing well. No changes in meds are needed.   Continue Xanax  1 mg, 1 po tid prn. Continue Cymbalta  30 mg, 3 daily.  Continue Seroquel  400 mg, 2 p.o. nightly.  Continue Trazodone  100 mg, 1.5 pills at bedtime prn. Return in 5-6 months.   Verneita Cooks, PA-C

## 2024-06-14 ENCOUNTER — Other Ambulatory Visit: Payer: Self-pay | Admitting: Physician Assistant

## 2024-06-24 ENCOUNTER — Other Ambulatory Visit: Payer: Self-pay | Admitting: Physician Assistant

## 2024-06-24 DIAGNOSIS — F411 Generalized anxiety disorder: Secondary | ICD-10-CM

## 2024-07-08 ENCOUNTER — Other Ambulatory Visit: Payer: Self-pay | Admitting: Physician Assistant

## 2024-10-31 ENCOUNTER — Telehealth: Admitting: Physician Assistant
